# Patient Record
Sex: Female | Born: 1985 | Race: Black or African American | Hispanic: No | Marital: Single | Smoking: Never smoker
Health system: Southern US, Community
[De-identification: ages and names within clinical notes are randomized; demographics above are authoritative.]

## PROBLEM LIST (undated history)

## (undated) DIAGNOSIS — K219 Gastro-esophageal reflux disease without esophagitis: Secondary | ICD-10-CM

## (undated) DIAGNOSIS — F309 Manic episode, unspecified: Secondary | ICD-10-CM

## (undated) DIAGNOSIS — B009 Herpesviral infection, unspecified: Secondary | ICD-10-CM

## (undated) DIAGNOSIS — T4145XA Adverse effect of unspecified anesthetic, initial encounter: Secondary | ICD-10-CM

## (undated) DIAGNOSIS — T883XXA Malignant hyperthermia due to anesthesia, initial encounter: Secondary | ICD-10-CM

## (undated) DIAGNOSIS — B999 Unspecified infectious disease: Secondary | ICD-10-CM

## (undated) DIAGNOSIS — Z8742 Personal history of other diseases of the female genital tract: Secondary | ICD-10-CM

## (undated) DIAGNOSIS — R51 Headache: Secondary | ICD-10-CM

## (undated) DIAGNOSIS — Z8619 Personal history of other infectious and parasitic diseases: Secondary | ICD-10-CM

## (undated) DIAGNOSIS — Z8659 Personal history of other mental and behavioral disorders: Secondary | ICD-10-CM

## (undated) DIAGNOSIS — I10 Essential (primary) hypertension: Secondary | ICD-10-CM

## (undated) DIAGNOSIS — T8859XA Other complications of anesthesia, initial encounter: Secondary | ICD-10-CM

## (undated) DIAGNOSIS — O139 Gestational [pregnancy-induced] hypertension without significant proteinuria, unspecified trimester: Secondary | ICD-10-CM

## (undated) HISTORY — DX: Other complications of anesthesia, initial encounter: T88.59XA

## (undated) HISTORY — DX: Adverse effect of unspecified anesthetic, initial encounter: T41.45XA

## (undated) HISTORY — DX: Essential (primary) hypertension: I10

## (undated) HISTORY — DX: Gestational (pregnancy-induced) hypertension without significant proteinuria, unspecified trimester: O13.9

---
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
## 2005-07-22 ENCOUNTER — Emergency Department (HOSPITAL_COMMUNITY): Admission: EM | Admit: 2005-07-22 | Discharge: 2005-07-22 | Payer: Self-pay | Admitting: Family Medicine

## 2005-10-25 LAB — OB RESULTS CONSOLE VARICELLA ZOSTER ANTIBODY, IGG: Varicella: IMMUNE

## 2005-11-03 ENCOUNTER — Ambulatory Visit: Payer: Self-pay | Admitting: Family Medicine

## 2005-11-03 ENCOUNTER — Encounter (INDEPENDENT_AMBULATORY_CARE_PROVIDER_SITE_OTHER): Payer: Self-pay | Admitting: *Deleted

## 2005-11-04 ENCOUNTER — Ambulatory Visit: Payer: Self-pay | Admitting: Obstetrics & Gynecology

## 2005-11-17 ENCOUNTER — Ambulatory Visit: Payer: Self-pay | Admitting: Gynecology

## 2005-12-12 ENCOUNTER — Ambulatory Visit (HOSPITAL_COMMUNITY): Admission: RE | Admit: 2005-12-12 | Discharge: 2005-12-12 | Payer: Self-pay | Admitting: Obstetrics and Gynecology

## 2005-12-12 ENCOUNTER — Ambulatory Visit: Payer: Self-pay | Admitting: Gynecology

## 2005-12-15 ENCOUNTER — Ambulatory Visit: Payer: Self-pay | Admitting: Gynecology

## 2005-12-19 ENCOUNTER — Ambulatory Visit: Payer: Self-pay | Admitting: *Deleted

## 2006-01-02 ENCOUNTER — Ambulatory Visit: Payer: Self-pay | Admitting: Family Medicine

## 2006-01-23 ENCOUNTER — Ambulatory Visit: Payer: Self-pay | Admitting: Obstetrics & Gynecology

## 2006-01-24 ENCOUNTER — Ambulatory Visit (HOSPITAL_COMMUNITY): Admission: RE | Admit: 2006-01-24 | Discharge: 2006-01-24 | Payer: Self-pay | Admitting: Gynecology

## 2006-02-09 ENCOUNTER — Ambulatory Visit: Payer: Self-pay | Admitting: Obstetrics & Gynecology

## 2006-02-23 ENCOUNTER — Ambulatory Visit: Payer: Self-pay | Admitting: Family Medicine

## 2006-03-09 ENCOUNTER — Ambulatory Visit (HOSPITAL_COMMUNITY): Admission: RE | Admit: 2006-03-09 | Discharge: 2006-03-09 | Payer: Self-pay | Admitting: Obstetrics and Gynecology

## 2006-03-09 ENCOUNTER — Ambulatory Visit: Payer: Self-pay | Admitting: Obstetrics & Gynecology

## 2006-03-13 ENCOUNTER — Ambulatory Visit: Payer: Self-pay | Admitting: Family Medicine

## 2006-03-16 ENCOUNTER — Ambulatory Visit: Payer: Self-pay | Admitting: Family Medicine

## 2006-03-20 ENCOUNTER — Ambulatory Visit: Payer: Self-pay | Admitting: Obstetrics & Gynecology

## 2006-03-23 ENCOUNTER — Ambulatory Visit: Payer: Self-pay | Admitting: Family Medicine

## 2006-03-27 ENCOUNTER — Ambulatory Visit: Payer: Self-pay | Admitting: Obstetrics & Gynecology

## 2006-03-30 ENCOUNTER — Ambulatory Visit: Payer: Self-pay | Admitting: Family Medicine

## 2006-04-03 ENCOUNTER — Ambulatory Visit: Payer: Self-pay | Admitting: Family Medicine

## 2006-04-06 ENCOUNTER — Ambulatory Visit: Payer: Self-pay | Admitting: Family Medicine

## 2006-04-10 ENCOUNTER — Ambulatory Visit: Payer: Self-pay | Admitting: Obstetrics and Gynecology

## 2006-04-13 ENCOUNTER — Ambulatory Visit: Payer: Self-pay | Admitting: Family Medicine

## 2006-04-13 ENCOUNTER — Ambulatory Visit (HOSPITAL_COMMUNITY): Admission: RE | Admit: 2006-04-13 | Discharge: 2006-04-13 | Payer: Self-pay | Admitting: Family Medicine

## 2006-04-17 ENCOUNTER — Ambulatory Visit: Payer: Self-pay | Admitting: Gynecology

## 2006-04-20 ENCOUNTER — Ambulatory Visit: Payer: Self-pay | Admitting: Family Medicine

## 2006-04-21 ENCOUNTER — Ambulatory Visit: Payer: Self-pay | Admitting: Obstetrics & Gynecology

## 2006-04-22 ENCOUNTER — Ambulatory Visit: Payer: Self-pay | Admitting: Obstetrics and Gynecology

## 2006-04-22 ENCOUNTER — Inpatient Hospital Stay (HOSPITAL_COMMUNITY): Admission: AD | Admit: 2006-04-22 | Discharge: 2006-04-22 | Payer: Self-pay | Admitting: Gynecology

## 2006-04-24 ENCOUNTER — Ambulatory Visit: Payer: Self-pay | Admitting: Obstetrics & Gynecology

## 2006-04-24 ENCOUNTER — Ambulatory Visit (HOSPITAL_COMMUNITY): Admission: RE | Admit: 2006-04-24 | Discharge: 2006-04-24 | Payer: Self-pay | Admitting: Obstetrics & Gynecology

## 2006-04-27 ENCOUNTER — Inpatient Hospital Stay (HOSPITAL_COMMUNITY): Admission: AD | Admit: 2006-04-27 | Discharge: 2006-04-29 | Payer: Self-pay | Admitting: Obstetrics & Gynecology

## 2006-04-27 ENCOUNTER — Ambulatory Visit: Payer: Self-pay | Admitting: *Deleted

## 2006-04-27 ENCOUNTER — Ambulatory Visit: Payer: Self-pay | Admitting: Family Medicine

## 2006-04-27 ENCOUNTER — Encounter (INDEPENDENT_AMBULATORY_CARE_PROVIDER_SITE_OTHER): Payer: Self-pay | Admitting: Specialist

## 2006-09-06 ENCOUNTER — Emergency Department (HOSPITAL_COMMUNITY): Admission: EM | Admit: 2006-09-06 | Discharge: 2006-09-06 | Payer: Self-pay | Admitting: Family Medicine

## 2007-06-19 ENCOUNTER — Emergency Department (HOSPITAL_COMMUNITY): Admission: EM | Admit: 2007-06-19 | Discharge: 2007-06-19 | Payer: Self-pay | Admitting: Emergency Medicine

## 2007-12-08 ENCOUNTER — Emergency Department (HOSPITAL_COMMUNITY): Admission: EM | Admit: 2007-12-08 | Discharge: 2007-12-08 | Payer: Self-pay | Admitting: Emergency Medicine

## 2009-02-02 ENCOUNTER — Emergency Department (HOSPITAL_COMMUNITY): Admission: EM | Admit: 2009-02-02 | Discharge: 2009-02-02 | Payer: Self-pay | Admitting: Emergency Medicine

## 2010-02-07 ENCOUNTER — Encounter: Payer: Self-pay | Admitting: *Deleted

## 2010-04-04 LAB — URINALYSIS, ROUTINE W REFLEX MICROSCOPIC
Ketones, ur: 15 mg/dL — AB
Nitrite: NEGATIVE
Urobilinogen, UA: 1 mg/dL (ref 0.0–1.0)

## 2010-04-04 LAB — URINE MICROSCOPIC-ADD ON

## 2010-04-04 LAB — POCT I-STAT, CHEM 8
Calcium, Ion: 1.1 mmol/L — ABNORMAL LOW (ref 1.12–1.32)
Creatinine, Ser: 0.8 mg/dL (ref 0.4–1.2)
Glucose, Bld: 124 mg/dL — ABNORMAL HIGH (ref 70–99)
Potassium: 3.9 mEq/L (ref 3.5–5.1)
Sodium: 140 mEq/L (ref 135–145)

## 2010-04-26 DIAGNOSIS — F309 Manic episode, unspecified: Secondary | ICD-10-CM | POA: Insufficient documentation

## 2010-04-26 DIAGNOSIS — A6000 Herpesviral infection of urogenital system, unspecified: Secondary | ICD-10-CM | POA: Insufficient documentation

## 2010-10-29 LAB — POCT PREGNANCY, URINE: Preg Test, Ur: NEGATIVE

## 2010-12-02 DIAGNOSIS — E559 Vitamin D deficiency, unspecified: Secondary | ICD-10-CM | POA: Insufficient documentation

## 2011-04-26 DIAGNOSIS — F32A Depression, unspecified: Secondary | ICD-10-CM | POA: Insufficient documentation

## 2011-04-26 DIAGNOSIS — F329 Major depressive disorder, single episode, unspecified: Secondary | ICD-10-CM | POA: Insufficient documentation

## 2012-01-18 NOTE — L&D Delivery Note (Signed)
Delivery Note At 11:33 AM a viable female was delivered via Vaginal, Spontaneous Delivery (Presentation: Right Occiput Anterior).  APGAR: 9, 9; weight .   Placenta status: Intact, Spontaneous.  Cord: 3 vessels with the following complications: None.    Anesthesia: Epidural  Episiotomy: None Lacerations: None Suture Repair: NA Est. Blood Loss (mL): 450  NSVD over intact perineum. Viable crying infant to abdomen. Active management of 3rd stage with pit and traction. Placenta delivered intact with 3V cord. Pt had excessive bleeding and slow firming of uterus. Pt given Cytotec PR and improved in bleeding with multiple sweeps. Hemostatic after cytotec administration. EBL 450.  Mom to postpartum.  Baby to nursery-stable.  Tawana Scale 09/12/2012, 11:58 AM

## 2012-04-17 ENCOUNTER — Encounter (HOSPITAL_COMMUNITY): Payer: Self-pay

## 2012-04-17 ENCOUNTER — Inpatient Hospital Stay (HOSPITAL_COMMUNITY): Payer: Medicaid Other

## 2012-04-17 ENCOUNTER — Inpatient Hospital Stay (HOSPITAL_COMMUNITY)
Admission: AD | Admit: 2012-04-17 | Discharge: 2012-04-17 | Disposition: A | Discharge status: Home / Self Care | Payer: Medicaid Other | Source: Ambulatory Visit | Attending: Obstetrics & Gynecology | Admitting: Obstetrics & Gynecology

## 2012-04-17 DIAGNOSIS — R109 Unspecified abdominal pain: Secondary | ICD-10-CM | POA: Insufficient documentation

## 2012-04-17 DIAGNOSIS — O99891 Other specified diseases and conditions complicating pregnancy: Secondary | ICD-10-CM | POA: Insufficient documentation

## 2012-04-17 DIAGNOSIS — O26899 Other specified pregnancy related conditions, unspecified trimester: Secondary | ICD-10-CM

## 2012-04-17 HISTORY — DX: Unspecified infectious disease: B99.9

## 2012-04-17 LAB — CBC
HCT: 36.8 % (ref 36.0–46.0)
MCV: 86.4 fL (ref 78.0–100.0)
Platelets: 258 10*3/uL (ref 150–400)
RDW: 13.4 % (ref 11.5–15.5)

## 2012-04-17 LAB — URINE MICROSCOPIC-ADD ON

## 2012-04-17 LAB — URINALYSIS, ROUTINE W REFLEX MICROSCOPIC
Bilirubin Urine: NEGATIVE
Glucose, UA: NEGATIVE mg/dL
Nitrite: NEGATIVE
Specific Gravity, Urine: >1.03 — ABNORMAL HIGH (ref 1.005–1.030)

## 2012-04-17 NOTE — MAU Provider Note (Signed)
History     CSN: [id number]  Arrival date and time: 04/17/12 1313   First Provider Initiated Contact with Patient 04/17/12 1559      Chief Complaint  Patient presents with  . Abdominal Pain   HPI Donna Thomas is a 27 y.o. female G2P1001 at unknown gestation who presents to MAU w/ complaint of lower abdominal pressure. She reports that she has had a positive pregnancy test at home, and believes that she is approximately [redacted] weeks gestation. She reports not having any similar abdominal pressure this early with her prior pregnancy. She did have some earlier spotting, but no recent vaginal bleeding, discharge, or leakage of fluid. She is having heartburn symptoms every other day, but is not using any treatment presently. No dysuria, hematuria, constipation, or diarrhea. She has not started pre-natal care at this point, and does not have any scheduled. She has started taking pre-natal vitamins. She reports no complications with her prior pregnancy.  Past Medical History  Diagnosis Date  . Infection     History reviewed. No pertinent past surgical history.  History reviewed. No pertinent family history.  History  Substance Use Topics  . Smoking status: Never Smoker   . Smokeless tobacco: Not on file  . Alcohol Use: No    Allergies: No Known Allergies  Prescriptions prior to admission  Medication Sig Dispense Refill  . Prenatal Vit-Fe Fumarate-FA (PRENATAL MULTIVITAMIN) TABS Take 1 tablet by mouth daily at 12 noon.        Review of Systems  Constitutional: Negative for fever and chills.  HENT: Negative for congestion.   Eyes: Negative for blurred vision and double vision.  Respiratory: Negative for cough, shortness of breath and wheezing.   Cardiovascular: Negative for chest pain, palpitations and leg swelling.  Gastrointestinal: Positive for heartburn, nausea, vomiting and abdominal pain (lower abdominal pressure). Negative for diarrhea and constipation.  Genitourinary: Negative  for dysuria, hematuria and flank pain.       Negative for vaginal bleeding, discharge, or leakage of fluid.  Musculoskeletal: Negative for back pain.  Neurological: Negative for dizziness, tingling, sensory change, focal weakness and headaches.   Physical Exam   Blood pressure 133/86, pulse 95, temperature 98.6 F (37 C), temperature source Oral, resp. rate 18, height 5\' 6"  (1.676 m), weight 111.698 kg (246 lb 4 oz), last menstrual period 02/12/2012, SpO2 100.00%.  Physical Exam  Nursing note and vitals reviewed. Constitutional: She is oriented to person, place, and time. She appears well-developed. No distress.  obese  HENT:  Head: Normocephalic and atraumatic.  Eyes: Conjunctivae and EOM are normal. Pupils are equal, round, and reactive to light.  Neck: Normal range of motion. Neck supple.  Cardiovascular: Normal rate, regular rhythm, normal heart sounds and intact distal pulses.   Respiratory: Effort normal and breath sounds normal. No respiratory distress.  GI: Soft. Bowel sounds are normal. She exhibits distension (gravid). There is no tenderness. There is no rebound, no guarding and no CVA tenderness.  Genitourinary: Uterus is not tender. Cervix exhibits no motion tenderness, no discharge and no friability. Right adnexum displays no mass and no tenderness. Left adnexum displays no mass and no tenderness. No tenderness or bleeding around the vagina. Vaginal discharge (slight milky white discharge ) found.  Lymphadenopathy:    She has no cervical adenopathy.  Neurological: She is alert and oriented to person, place, and time.  Skin: Skin is warm and dry.  Psychiatric: She has a normal mood and affect. Her behavior is normal. Judgment  and thought content normal.    MAU Course  Procedures  Results for orders placed during the hospital encounter of 04/17/12 (from the past 24 hour(s))  URINALYSIS, ROUTINE W REFLEX MICROSCOPIC     Status: Abnormal   Collection Time    04/17/12  2:16  PM      Result Value Range   Color, Urine YELLOW  YELLOW   APPearance CLEAR  CLEAR   Specific Gravity, Urine >1.030 (*) 1.005 - 1.030   pH 6.0  5.0 - 8.0   Glucose, UA NEGATIVE  NEGATIVE mg/dL   Hgb urine dipstick MODERATE (*) NEGATIVE   Bilirubin Urine NEGATIVE  NEGATIVE   Ketones, ur NEGATIVE  NEGATIVE mg/dL   Protein, ur NEGATIVE  NEGATIVE mg/dL   Urobilinogen, UA 0.2  0.0 - 1.0 mg/dL   Nitrite NEGATIVE  NEGATIVE   Leukocytes, UA MODERATE (*) NEGATIVE  URINE MICROSCOPIC-ADD ON     Status: Abnormal   Collection Time    04/17/12  2:16 PM      Result Value Range   Squamous Epithelial / LPF FEW (*) RARE   WBC, UA 7-10  <3 WBC/hpf   RBC / HPF 0-2  <3 RBC/hpf   Bacteria, UA FEW (*) RARE   Urine-Other MUCOUS PRESENT    POCT PREGNANCY, URINE     Status: Abnormal   Collection Time    04/17/12  2:29 PM      Result Value Range   Preg Test, Ur POSITIVE (*) NEGATIVE  CBC     Status: None   Collection Time    04/17/12  3:00 PM      Result Value Range   WBC 7.5  4.0 - 10.5 K/uL   RBC 4.26  3.87 - 5.11 MIL/uL   Hemoglobin 12.6  12.0 - 15.0 g/dL   HCT 16.1  09.6 - 04.5 %   MCV 86.4  78.0 - 100.0 fL   MCH 29.6  26.0 - 34.0 pg   MCHC 34.2  30.0 - 36.0 g/dL   RDW 40.9  81.1 - 91.4 %   Platelets 258  150 - 400 K/uL  ABO/RH     Status: None   Collection Time    04/17/12  3:00 PM      Result Value Range   ABO/RH(D) A POS    HCG, QUANTITATIVE, PREGNANCY     Status: Abnormal   Collection Time    04/17/12  3:00 PM      Result Value Range   hCG, Beta Chain, Quant, S 28885 (*) <5 mIU/mL   Wet prep performed- inadequate specimen with no saline, per lab-  Discussed with pt- pt refused another swab for wet prep GC/chlamydia pending Hematuria- asymptomatic- urine culture pending     Assessment and Plan  1. IUP at [redacted]w[redacted]d by ultrasound  - D/C to home  - Continue prenatal vitamins  - Follow-up with OB clinic to establish care  Follow-up Information   Follow up with [facility] [place] [facility]. (As needed if symptoms worsen)    Contact information:   [address] [place] [address] [phone]      Follow up with [facility]. (Will call with appointment for pre-natal care, call if have not heard by next week)    Contact information:   [address] [place] [address] [phone]     I examined pt with Corky Mull and agree with management  Dorrene German 04/17/2012, 4:57 PM

## 2012-04-20 LAB — URINE CULTURE

## 2012-04-21 ENCOUNTER — Other Ambulatory Visit: Payer: Self-pay | Admitting: Medical

## 2012-04-21 ENCOUNTER — Telehealth (HOSPITAL_COMMUNITY): Payer: Self-pay | Admitting: Gynecology

## 2012-04-21 ENCOUNTER — Telehealth: Payer: Self-pay | Admitting: Medical

## 2012-04-21 DIAGNOSIS — O2342 Unspecified infection of urinary tract in pregnancy, second trimester: Secondary | ICD-10-CM

## 2012-04-21 MED ORDER — CEPHALEXIN 500 MG PO CAPS: 500.0000 mg | ORAL_CAPSULE | Freq: Four times a day (QID) | ORAL | Status: DC

## 2012-04-21 NOTE — Telephone Encounter (Signed)
LM for patient to return call to MAU. Rx for Keflex for UTI sent to [facility] on [address].  Freddi Starr, PA-C  04/21/2012 2:00 PM

## 2012-04-23 ENCOUNTER — Telehealth: Payer: Self-pay | Admitting: General Practice

## 2012-04-23 NOTE — Telephone Encounter (Signed)
Message copied by Kathee Delton on Mon Apr 23, 2012 11:29 AM ------      Message from: Allie Bossier      Created: Fri Apr 20, 2012 10:03 AM       Please call her with a prescription for macrobid BID for 10 days.       Thanks ------

## 2012-04-23 NOTE — Telephone Encounter (Signed)
Called patient and informed her of results- patient already aware. Rx for keflex already been sent it. Patient verbalized understanding, no further questions

## 2012-04-24 ENCOUNTER — Encounter: Payer: Self-pay | Admitting: *Deleted

## 2012-04-24 ENCOUNTER — Other Ambulatory Visit: Payer: Medicaid Other

## 2012-04-25 LAB — OBSTETRIC PANEL
Antibody Screen: NEGATIVE
HCT: 36.8 % (ref 36.0–46.0)
Hemoglobin: 12.6 g/dL (ref 12.0–15.0)
Lymphocytes Relative: 16 % (ref 12–46)
MCHC: 34.2 g/dL (ref 30.0–36.0)
Monocytes Absolute: 0.6 10*3/uL (ref 0.1–1.0)
Monocytes Relative: 9 % (ref 3–12)
Neutro Abs: 5.2 10*3/uL (ref 1.7–7.7)
Rh Type: POSITIVE
Rubella: 4.05 Index — ABNORMAL HIGH (ref <0.90)
WBC: 7 10*3/uL (ref 4.0–10.5)

## 2012-05-22 ENCOUNTER — Other Ambulatory Visit (HOSPITAL_COMMUNITY)
Admission: RE | Admit: 2012-05-22 | Discharge: 2012-05-22 | Disposition: A | Discharge status: Home / Self Care | Payer: Medicaid Other | Source: Ambulatory Visit | Attending: Obstetrics & Gynecology | Admitting: Obstetrics & Gynecology

## 2012-05-22 ENCOUNTER — Other Ambulatory Visit: Payer: Self-pay | Admitting: Obstetrics & Gynecology

## 2012-05-22 ENCOUNTER — Encounter: Payer: Self-pay | Admitting: Obstetrics & Gynecology

## 2012-05-22 ENCOUNTER — Ambulatory Visit (INDEPENDENT_AMBULATORY_CARE_PROVIDER_SITE_OTHER): Payer: Medicaid Other | Admitting: Obstetrics & Gynecology

## 2012-05-22 VITALS — BP 134/88 | Wt 246.8 lb

## 2012-05-22 DIAGNOSIS — Z1151 Encounter for screening for human papillomavirus (HPV): Secondary | ICD-10-CM | POA: Insufficient documentation

## 2012-05-22 DIAGNOSIS — Z01419 Encounter for gynecological examination (general) (routine) without abnormal findings: Secondary | ICD-10-CM | POA: Insufficient documentation

## 2012-05-22 DIAGNOSIS — O0930 Supervision of pregnancy with insufficient antenatal care, unspecified trimester: Secondary | ICD-10-CM | POA: Insufficient documentation

## 2012-05-22 DIAGNOSIS — O09292 Supervision of pregnancy with other poor reproductive or obstetric history, second trimester: Secondary | ICD-10-CM

## 2012-05-22 DIAGNOSIS — R87810 Cervical high risk human papillomavirus (HPV) DNA test positive: Secondary | ICD-10-CM | POA: Insufficient documentation

## 2012-05-22 DIAGNOSIS — O0932 Supervision of pregnancy with insufficient antenatal care, second trimester: Secondary | ICD-10-CM

## 2012-05-22 DIAGNOSIS — O09299 Supervision of pregnancy with other poor reproductive or obstetric history, unspecified trimester: Secondary | ICD-10-CM

## 2012-05-22 LAB — POCT URINALYSIS DIP (DEVICE)
Glucose, UA: NEGATIVE mg/dL
Hgb urine dipstick: NEGATIVE
Specific Gravity, Urine: >=1.03 (ref 1.005–1.030)

## 2012-05-22 NOTE — Progress Notes (Signed)
Prior H/O GHTN and SGA

## 2012-05-22 NOTE — Patient Instructions (Signed)
Pregnancy - Second Trimester The second trimester of pregnancy (3 to 6 months) is a period of rapid growth for you and your baby. At the end of the sixth month, your baby is about 9 inches long and weighs 1 1/2 pounds. You will begin to feel the baby move between 18 and 20 weeks of the pregnancy. This is called quickening. Weight gain is faster. A clear fluid (colostrum) may leak out of your breasts. You may feel small contractions of the womb (uterus). This is known as false labor or Braxton-Hicks contractions. This is like a practice for labor when the baby is ready to be born. Usually, the problems with morning sickness have usually passed by the end of your first trimester. Some women develop small dark blotches (called cholasma, mask of pregnancy) on their face that usually goes away after the baby is born. Exposure to the sun makes the blotches worse. Acne may also develop in some pregnant women and pregnant women who have acne, may find that it goes away. PRENATAL EXAMS  Blood work may continue to be done during prenatal exams. These tests are done to check on your health and the probable health of your baby. Blood work is used to follow your blood levels (hemoglobin). Anemia (low hemoglobin) is common during pregnancy. Iron and vitamins are given to help prevent this. You will also be checked for diabetes between 24 and 28 weeks of the pregnancy. Some of the previous blood tests may be repeated.  The size of the uterus is measured during each visit. This is to make sure that the baby is continuing to grow properly according to the dates of the pregnancy.  Your blood pressure is checked every prenatal visit. This is to make sure you are not getting toxemia.  Your urine is checked to make sure you do not have an infection, diabetes or protein in the urine.  Your weight is checked often to make sure gains are happening at the suggested rate. This is to ensure that both you and your baby are growing  normally.  Sometimes, an ultrasound is performed to confirm the proper growth and development of the baby. This is a test which bounces harmless sound waves off the baby so your caregiver can more accurately determine due dates. Sometimes, a specialized test is done on the amniotic fluid surrounding the baby. This test is called an amniocentesis. The amniotic fluid is obtained by sticking a needle into the belly (abdomen). This is done to check the chromosomes in instances where there is a concern about possible genetic problems with the baby. It is also sometimes done near the end of pregnancy if an early delivery is required. In this case, it is done to help make sure the baby's lungs are mature enough for the baby to live outside of the womb. CHANGES OCCURING IN THE SECOND TRIMESTER OF PREGNANCY Your body goes through many changes during pregnancy. They vary from person to person. Talk to your caregiver about changes you notice that you are concerned about.  During the second trimester, you will likely have an increase in your appetite. It is normal to have cravings for certain foods. This varies from person to person and pregnancy to pregnancy.  Your lower abdomen will begin to bulge.  You may have to urinate more often because the uterus and baby are pressing on your bladder. It is also common to get more bladder infections during pregnancy (pain with urination). You can help this by   drinking lots of fluids and emptying your bladder before and after intercourse.  You may begin to get stretch marks on your hips, abdomen, and breasts. These are normal changes in the body during pregnancy. There are no exercises or medications to take that prevent this change.  You may begin to develop swollen and bulging veins (varicose veins) in your legs. Wearing support hose, elevating your feet for 15 minutes, 3 to 4 times a day and limiting salt in your diet helps lessen the problem.  Heartburn may develop  as the uterus grows and pushes up against the stomach. Antacids recommended by your caregiver helps with this problem. Also, eating smaller meals 4 to 5 times a day helps.  Constipation can be treated with a stool softener or adding bulk to your diet. Drinking lots of fluids, vegetables, fruits, and whole grains are helpful.  Exercising is also helpful. If you have been very active up until your pregnancy, most of these activities can be continued during your pregnancy. If you have been less active, it is helpful to start an exercise program such as walking.  Hemorrhoids (varicose veins in the rectum) may develop at the end of the second trimester. Warm sitz baths and hemorrhoid cream recommended by your caregiver helps hemorrhoid problems.  Backaches may develop during this time of your pregnancy. Avoid heavy lifting, wear low heal shoes and practice good posture to help with backache problems.  Some pregnant women develop tingling and numbness of their hand and fingers because of swelling and tightening of ligaments in the wrist (carpel tunnel syndrome). This goes away after the baby is born.  As your breasts enlarge, you may have to get a bigger bra. Get a comfortable, cotton, support bra. Do not get a nursing bra until the last month of the pregnancy if you will be nursing the baby.  You may get a dark line from your belly button to the pubic area called the linea nigra.  You may develop rosy cheeks because of increase blood flow to the face.  You may develop spider looking lines of the face, neck, arms and chest. These go away after the baby is born. HOME CARE INSTRUCTIONS   It is extremely important to avoid all smoking, herbs, alcohol, and unprescribed drugs during your pregnancy. These chemicals affect the formation and growth of the baby. Avoid these chemicals throughout the pregnancy to ensure the delivery of a healthy infant.  Most of your home care instructions are the same as  suggested for the first trimester of your pregnancy. Keep your caregiver's appointments. Follow your caregiver's instructions regarding medication use, exercise and diet.  During pregnancy, you are providing food for you and your baby. Continue to eat regular, well-balanced meals. Choose foods such as meat, fish, milk and other low fat dairy products, vegetables, fruits, and whole-grain breads and cereals. Your caregiver will tell you of the ideal weight gain.  A physical sexual relationship may be continued up until near the end of pregnancy if there are no other problems. Problems could include early (premature) leaking of amniotic fluid from the membranes, vaginal bleeding, abdominal pain, or other medical or pregnancy problems.  Exercise regularly if there are no restrictions. Check with your caregiver if you are unsure of the safety of some of your exercises. The greatest weight gain will occur in the last 2 trimesters of pregnancy. Exercise will help you:  Control your weight.  Get you in shape for labor and delivery.  Lose weight   after you have the baby.  Wear a good support or jogging bra for breast tenderness during pregnancy. This may help if worn during sleep. Pads or tissues may be used in the bra if you are leaking colostrum.  Do not use hot tubs, steam rooms or saunas throughout the pregnancy.  Wear your seat belt at all times when driving. This protects you and your baby if you are in an accident.  Avoid raw meat, uncooked cheese, cat litter boxes and soil used by cats. These carry germs that can cause birth defects in the baby.  The second trimester is also a good time to visit your dentist for your dental health if this has not been done yet. Getting your teeth cleaned is OK. Use a soft toothbrush. Brush gently during pregnancy.  It is easier to loose urine during pregnancy. Tightening up and strengthening the pelvic muscles will help with this problem. Practice stopping your  urination while you are going to the bathroom. These are the same muscles you need to strengthen. It is also the muscles you would use as if you were trying to stop from passing gas. You can practice tightening these muscles up 10 times a set and repeating this about 3 times per day. Once you know what muscles to tighten up, do not perform these exercises during urination. It is more likely to contribute to an infection by backing up the urine.  Ask for help if you have financial, counseling or nutritional needs during pregnancy. Your caregiver will be able to offer counseling for these needs as well as refer you for other special needs.  Your skin may become oily. If so, wash your face with mild soap, use non-greasy moisturizer and oil or cream based makeup. MEDICATIONS AND DRUG USE IN PREGNANCY  Take prenatal vitamins as directed. The vitamin should contain 1 milligram of folic acid. Keep all vitamins out of reach of children. Only a couple vitamins or tablets containing iron may be fatal to a baby or young child when ingested.  Avoid use of all medications, including herbs, over-the-counter medications, not prescribed or suggested by your caregiver. Only take over-the-counter or prescription medicines for pain, discomfort, or fever as directed by your caregiver. Do not use aspirin.  Let your caregiver also know about herbs you may be using.  Alcohol is related to a number of birth defects. This includes fetal alcohol syndrome. All alcohol, in any form, should be avoided completely. Smoking will cause low birth rate and premature babies.  Street or illegal drugs are very harmful to the baby. They are absolutely forbidden. A baby born to an addicted mother will be addicted at birth. The baby will go through the same withdrawal an adult does. SEEK MEDICAL CARE IF:  You have any concerns or worries during your pregnancy. It is better to call with your questions if you feel they cannot wait, rather  than worry about them. SEEK IMMEDIATE MEDICAL CARE IF:   An unexplained oral temperature above 102 F (38.9 C) develops, or as your caregiver suggests.  You have leaking of fluid from the vagina (birth canal). If leaking membranes are suspected, take your temperature and tell your caregiver of this when you call.  There is vaginal spotting, bleeding, or passing clots. Tell your caregiver of the amount and how many pads are used. Light spotting in pregnancy is common, especially following intercourse.  You develop a bad smelling vaginal discharge with a change in the color from clear   to white.  You continue to feel sick to your stomach (nauseated) and have no relief from remedies suggested. You vomit blood or coffee ground-like materials.  You lose more than 2 pounds of weight or gain more than 2 pounds of weight over 1 week, or as suggested by your caregiver.  You notice swelling of your face, hands, feet, or legs.  You get exposed to German measles and have never had them.  You are exposed to fifth disease or chickenpox.  You develop belly (abdominal) pain. Round ligament discomfort is a common non-cancerous (benign) cause of abdominal pain in pregnancy. Your caregiver still must evaluate you.  You develop a bad headache that does not go away.  You develop fever, diarrhea, pain with urination, or shortness of breath.  You develop visual problems, blurry, or double vision.  You fall or are in a car accident or any kind of trauma.  There is mental or physical violence at home. Document Released: 12/28/2000 Document Revised: 03/28/2011 Document Reviewed: 07/02/2008 [employer] Patient Information 2013 [employer].  

## 2012-05-22 NOTE — Progress Notes (Signed)
Pulse: 86 Would like to breastfeed/pump.

## 2012-05-23 ENCOUNTER — Other Ambulatory Visit: Payer: Self-pay | Admitting: Obstetrics & Gynecology

## 2012-05-23 LAB — WET PREP, GENITAL: Trich, Wet Prep: NONE SEEN

## 2012-05-23 MED ORDER — METRONIDAZOLE 500 MG PO TABS: 500.0000 mg | ORAL_TABLET | Freq: Two times a day (BID) | ORAL | Status: AC

## 2012-05-23 NOTE — Progress Notes (Signed)
Flagyl 500 mg BID 7 days for BV

## 2012-05-24 ENCOUNTER — Telehealth: Payer: Self-pay | Admitting: *Deleted

## 2012-05-24 ENCOUNTER — Ambulatory Visit (HOSPITAL_COMMUNITY): Payer: Medicaid Other

## 2012-05-24 NOTE — Telephone Encounter (Signed)
Called Donna Thomas to notify wet prep showed BV and prescription sent to her pharmacy. Patient voices understanding.

## 2012-05-31 ENCOUNTER — Other Ambulatory Visit (HOSPITAL_COMMUNITY)
Admission: RE | Admit: 2012-05-31 | Discharge: 2012-05-31 | Disposition: A | Discharge status: Home / Self Care | Payer: Medicaid Other | Source: Ambulatory Visit | Attending: Family Medicine | Admitting: Family Medicine

## 2012-05-31 ENCOUNTER — Ambulatory Visit (HOSPITAL_COMMUNITY)
Admission: RE | Admit: 2012-05-31 | Discharge: 2012-05-31 | Disposition: A | Discharge status: Home / Self Care | Payer: Medicaid Other | Source: Ambulatory Visit | Attending: Obstetrics & Gynecology | Admitting: Obstetrics & Gynecology

## 2012-05-31 ENCOUNTER — Ambulatory Visit (INDEPENDENT_AMBULATORY_CARE_PROVIDER_SITE_OTHER): Payer: Medicaid Other | Admitting: Family Medicine

## 2012-05-31 VITALS — BP 123/78 | Temp 97.2°F | Wt 247.2 lb

## 2012-05-31 DIAGNOSIS — R87619 Unspecified abnormal cytological findings in specimens from cervix uteri: Secondary | ICD-10-CM | POA: Insufficient documentation

## 2012-05-31 DIAGNOSIS — Z363 Encounter for antenatal screening for malformations: Secondary | ICD-10-CM | POA: Insufficient documentation

## 2012-05-31 DIAGNOSIS — O358XX0 Maternal care for other (suspected) fetal abnormality and damage, not applicable or unspecified: Secondary | ICD-10-CM | POA: Insufficient documentation

## 2012-05-31 DIAGNOSIS — IMO0002 Reserved for concepts with insufficient information to code with codable children: Secondary | ICD-10-CM

## 2012-05-31 DIAGNOSIS — O09292 Supervision of pregnancy with other poor reproductive or obstetric history, second trimester: Secondary | ICD-10-CM

## 2012-05-31 DIAGNOSIS — O0930 Supervision of pregnancy with insufficient antenatal care, unspecified trimester: Secondary | ICD-10-CM

## 2012-05-31 DIAGNOSIS — Z1389 Encounter for screening for other disorder: Secondary | ICD-10-CM | POA: Insufficient documentation

## 2012-05-31 DIAGNOSIS — R87811 Vaginal high risk human papillomavirus (HPV) DNA test positive: Secondary | ICD-10-CM

## 2012-05-31 DIAGNOSIS — O0932 Supervision of pregnancy with insufficient antenatal care, second trimester: Secondary | ICD-10-CM

## 2012-05-31 LAB — POCT URINALYSIS DIP (DEVICE)
Bilirubin Urine: NEGATIVE
Glucose, UA: NEGATIVE mg/dL
Nitrite: NEGATIVE
Urobilinogen, UA: 0.2 mg/dL (ref 0.0–1.0)
pH: 6.5 (ref 5.0–8.0)

## 2012-05-31 NOTE — Progress Notes (Signed)
Pulse: 82

## 2012-05-31 NOTE — Patient Instructions (Addendum)
Pregnancy - Third Trimester The third trimester of pregnancy (the last 3 months) is a period of the most rapid growth for you and your baby. The baby approaches a length of 20 inches and a weight of 6 to 10 pounds. The baby is adding on fat and getting ready for life outside your body. While inside, babies have periods of sleeping and waking, suck their thumbs, and hiccups. You can often feel small contractions of the uterus. This is false labor. It is also called Braxton-Hicks contractions. This is like a practice for labor. The usual problems in this stage of pregnancy include more difficulty breathing, swelling of the hands and feet from water retention, and having to urinate more often because of the uterus and baby pressing on your bladder.  PRENATAL EXAMS  Blood work may continue to be done during prenatal exams. These tests are done to check on your health and the probable health of your baby. Blood work is used to follow your blood levels (hemoglobin). Anemia (low hemoglobin) is common during pregnancy. Iron and vitamins are given to help prevent this. You may also continue to be checked for diabetes. Some of the past blood tests may be done again.  The size of the uterus is measured during each visit. This makes sure your baby is growing properly according to your pregnancy dates.  Your blood pressure is checked every prenatal visit. This is to make sure you are not getting toxemia.  Your urine is checked every prenatal visit for infection, diabetes and protein.  Your weight is checked at each visit. This is done to make sure gains are happening at the suggested rate and that you and your baby are growing normally.  Sometimes, an ultrasound is performed to confirm the position and the proper growth and development of the baby. This is a test done that bounces harmless sound waves off the baby so your caregiver can more accurately determine due dates.  Discuss the type of pain medication  and anesthesia you will have during your labor and delivery.  Discuss the possibility and anesthesia if a Cesarean Section might be necessary.  Inform your caregiver if there is any mental or physical violence at home. Sometimes, a specialized non-stress test, contraction stress test and biophysical profile are done to make sure the baby is not having a problem. Checking the amniotic fluid surrounding the baby is called an amniocentesis. The amniotic fluid is removed by sticking a needle into the belly (abdomen). This is sometimes done near the end of pregnancy if an early delivery is required. In this case, it is done to help make sure the baby's lungs are mature enough for the baby to live outside of the womb. If the lungs are not mature and it is unsafe to deliver the baby, an injection of cortisone medication is given to the mother 1 to 2 days before the delivery. This helps the baby's lungs mature and makes it safer to deliver the baby. CHANGES OCCURING IN THE THIRD TRIMESTER OF PREGNANCY Your body goes through many changes during pregnancy. They vary from person to person. Talk to your caregiver about changes you notice and are concerned about.  During the last trimester, you have probably had an increase in your appetite. It is normal to have cravings for certain foods. This varies from person to person and pregnancy to pregnancy.  You may begin to get stretch marks on your hips, abdomen, and breasts. These are normal changes in the body  during pregnancy. There are no exercises or medications to take which prevent this change.  Constipation may be treated with a stool softener or adding bulk to your diet. Drinking lots of fluids, fiber in vegetables, fruits, and whole grains are helpful.  Exercising is also helpful. If you have been very active up until your pregnancy, most of these activities can be continued during your pregnancy. If you have been less active, it is helpful to start an  exercise program such as walking. Consult your caregiver before starting exercise programs.  Avoid all smoking, alcohol, un-prescribed drugs, herbs and "street drugs" during your pregnancy. These chemicals affect the formation and growth of the baby. Avoid chemicals throughout the pregnancy to ensure the delivery of a healthy infant.  Backache, varicose veins and hemorrhoids may develop or get worse.  You will tire more easily in the third trimester, which is normal.  The baby's movements may be stronger and more often.  You may become short of breath easily.  Your belly button may stick out.  A yellow discharge may leak from your breasts called colostrum.  You may have a bloody mucus discharge. This usually occurs a few days to a week before labor begins. HOME CARE INSTRUCTIONS   Keep your caregiver's appointments. Follow your caregiver's instructions regarding medication use, exercise, and diet.  During pregnancy, you are providing food for you and your baby. Continue to eat regular, well-balanced meals. Choose foods such as meat, fish, milk and other low fat dairy products, vegetables, fruits, and whole-grain breads and cereals. Your caregiver will tell you of the ideal weight gain.  A physical sexual relationship may be continued throughout pregnancy if there are no other problems such as early (premature) leaking of amniotic fluid from the membranes, vaginal bleeding, or belly (abdominal) pain.  Exercise regularly if there are no restrictions. Check with your caregiver if you are unsure of the safety of your exercises. Greater weight gain will occur in the last 2 trimesters of pregnancy. Exercising helps:  Control your weight.  Get you in shape for labor and delivery.  You lose weight after you deliver.  Rest a lot with legs elevated, or as needed for leg cramps or low back pain.  Wear a good support or jogging bra for breast tenderness during pregnancy. This may help if worn  during sleep. Pads or tissues may be used in the bra if you are leaking colostrum.  Do not use hot tubs, steam rooms, or saunas.  Wear your seat belt when driving. This protects you and your baby if you are in an accident.  Avoid raw meat, cat litter boxes and soil used by cats. These carry germs that can cause birth defects in the baby.  It is easier to loose urine during pregnancy. Tightening up and strengthening the pelvic muscles will help with this problem. You can practice stopping your urination while you are going to the bathroom. These are the same muscles you need to strengthen. It is also the muscles you would use if you were trying to stop from passing gas. You can practice tightening these muscles up 10 times a set and repeating this about 3 times per day. Once you know what muscles to tighten up, do not perform these exercises during urination. It is more likely to cause an infection by backing up the urine.  Ask for help if you have financial, counseling or nutritional needs during pregnancy. Your caregiver will be able to offer counseling for these  needs as well as refer you for other special needs.  Make a list of emergency phone numbers and have them available.  Plan on getting help from family or friends when you go home from the hospital.  Make a trial run to the hospital.  Take prenatal classes with the father to understand, practice and ask questions about the labor and delivery.  Prepare the baby's room/nursery.  Do not travel out of the city unless it is absolutely necessary and with the advice of your caregiver.  Wear only low or no heal shoes to have better balance and prevent falling. MEDICATIONS AND DRUG USE IN PREGNANCY  Take prenatal vitamins as directed. The vitamin should contain 1 milligram of folic acid. Keep all vitamins out of reach of children. Only a couple vitamins or tablets containing iron may be fatal to a baby or young child when  ingested.  Avoid use of all medications, including herbs, over-the-counter medications, not prescribed or suggested by your caregiver. Only take over-the-counter or prescription medicines for pain, discomfort, or fever as directed by your caregiver. Do not use aspirin, ibuprofen (Motrin, Advil, Nuprin) or naproxen (Aleve) unless OK'd by your caregiver.  Let your caregiver also know about herbs you may be using.  Alcohol is related to a number of birth defects. This includes fetal alcohol syndrome. All alcohol, in any form, should be avoided completely. Smoking will cause low birth rate and premature babies.  Street/illegal drugs are very harmful to the baby. They are absolutely forbidden. A baby born to an addicted mother will be addicted at birth. The baby will go through the same withdrawal an adult does. SEEK MEDICAL CARE IF: You have any concerns or worries during your pregnancy. It is better to call with your questions if you feel they cannot wait, rather than worry about them. DECISIONS ABOUT CIRCUMCISION You may or may not know the sex of your baby. If you know your baby is a boy, it may be time to think about circumcision. Circumcision is the removal of the foreskin of the penis. This is the skin that covers the sensitive end of the penis. There is no proven medical need for this. Often this decision is made on what is popular at the time or based upon religious beliefs and social issues. You can discuss these issues with your caregiver or pediatrician. SEEK IMMEDIATE MEDICAL CARE IF:   An unexplained oral temperature above 102 F (38.9 C) develops, or as your caregiver suggests.  You have leaking of fluid from the vagina (birth canal). If leaking membranes are suspected, take your temperature and tell your caregiver of this when you call.  There is vaginal spotting, bleeding or passing clots. Tell your caregiver of the amount and how many pads are used.  You develop a bad smelling  vaginal discharge with a change in the color from clear to white.  You develop vomiting that lasts more than 24 hours.  You develop chills or fever.  You develop shortness of breath.  You develop burning on urination.  You loose more than 2 pounds of weight or gain more than 2 pounds of weight or as suggested by your caregiver.  You notice sudden swelling of your face, hands, and feet or legs.  You develop belly (abdominal) pain. Round ligament discomfort is a common non-cancerous (benign) cause of abdominal pain in pregnancy. Your caregiver still must evaluate you.  You develop a severe headache that does not go away.  You develop visual  problems, blurred or double vision.  If you have not felt your baby move for more than 1 hour. If you think the baby is not moving as much as usual, eat something with sugar in it and lie down on your left side for an hour. The baby should move at least 4 to 5 times per hour. Call right away if your baby moves less than that.  You fall, are in a car accident or any kind of trauma.  There is mental or physical violence at home. Document Released: 12/28/2000 Document Revised: 03/28/2011 Document Reviewed: 07/02/2008 [facility] Patient Information 2013 [place], [place].  Breastfeeding Deciding to breastfeed is one of the best choices you can make for you and your baby. The information that follows gives a brief overview of the benefits of breastfeeding as well as common topics surrounding breastfeeding. BENEFITS OF BREASTFEEDING For the baby  The first milk (colostrum) helps the baby's digestive system function better.   There are antibodies in the mother's milk that help the baby fight off infections.   The baby has a lower incidence of asthma, allergies, and sudden infant death syndrome (SIDS).   The nutrients in breast milk are better for the baby than infant formulas, and breast milk helps the baby's brain grow better.   Babies who  breastfeed have less gas, colic, and constipation.  For the mother  Breastfeeding helps develop a very special bond between the mother and her baby.   Breastfeeding is convenient, always available at the correct temperature, and costs nothing.   Breastfeeding burns calories in the mother and helps her lose weight that was gained during pregnancy.   Breastfeeding makes the uterus contract back down to normal size faster and slows bleeding following delivery.   Breastfeeding mothers have a lower risk of developing breast cancer.  BREASTFEEDING FREQUENCY  A healthy, full-term baby may breastfeed as often as every hour or space his or her feedings to every 3 hours.   Watch your baby for signs of hunger. Nurse your baby if he or she shows signs of hunger. How often you nurse will vary from baby to baby.   Nurse as often as the baby requests, or when you feel the need to reduce the fullness of your breasts.   Awaken the baby if it has been 3 4 hours since the last feeding.   Frequent feeding will help the mother make more milk and will help prevent problems, such as sore nipples and engorgement of the breasts.  BABY'S POSITION AT THE BREAST  Whether lying down or sitting, be sure that the baby's tummy is facing your tummy.   Support the breast with 4 fingers underneath the breast and the thumb above. Make sure your fingers are well away from the nipple and baby's mouth.   Stroke the baby's lips gently with your finger or nipple.   When the baby's mouth is open wide enough, place all of your nipple and as much of the areola as possible into your baby's mouth.   Pull the baby in close so the tip of the nose and the baby's cheeks touch the breast during the feeding.  FEEDINGS AND SUCTION  The length of each feeding varies from baby to baby and from feeding to feeding.   The baby must suck about 2 3 minutes for your milk to get to him or her. This is called a "let down."  For this reason, allow the baby to feed on each breast as  long as he or she wants. Your baby will end the feeding when he or she has received the right balance of nutrients.   To break the suction, put your finger into the corner of the baby's mouth and slide it between his or her gums before removing your breast from his or her mouth. This will help prevent sore nipples.  HOW TO TELL WHETHER YOUR BABY IS GETTING ENOUGH BREAST MILK. Wondering whether or not your baby is getting enough milk is a common concern among mothers. You can be assured that your baby is getting enough milk if:   Your baby is actively sucking and you hear swallowing.   Your baby seems relaxed and satisfied after a feeding.   Your baby nurses at least 8 12 times in a 24 hour time period. Nurse your baby until he or she unlatches or falls asleep at the first breast (at least 10 20 minutes), then offer the second side.   Your baby is wetting 5 6 disposable diapers (6 8 cloth diapers) in a 24 hour period by 71 46 days of age.   Your baby is having at least 3 4 stools every 24 hours for the first 6 weeks. The stool should be soft and yellow.   Your baby should gain 4 7 ounces per week after he or she is 47 days old.   Your breasts feel softer after nursing.  REDUCING BREAST ENGORGEMENT  In the first week after your baby is born, you may experience signs of breast engorgement. When breasts are engorged, they feel heavy, warm, full, and may be tender to the touch. You can reduce engorgement if you:   Nurse frequently, every 2 3 hours. Mothers who breastfeed early and often have fewer problems with engorgement.   Place light ice packs on your breasts for 10 20 minutes between feedings. This reduces swelling. Wrap the ice packs in a lightweight towel to protect your skin. Bags of frozen vegetables work well for this purpose.   Take a warm shower or apply warm, moist heat to your breast for 5 10 minutes just before  each feeding. This increases circulation and helps the milk flow.   Gently massage your breast before and during the feeding. Using your finger tips, massage from the chest wall towards your nipple in a circular motion.   Make sure that the baby empties at least one breast at every feeding before switching sides.   Use a breast pump to empty the breasts if your baby is sleepy or not nursing well. You may also want to pump if you are returning to work oryou feel you are getting engorged.   Avoid bottle feeds, pacifiers, or supplemental feedings of water or juice in place of breastfeeding. Breast milk is all the food your baby needs. It is not necessary for your baby to have water or formula. In fact, to help your breasts make more milk, it is best not to give your baby supplemental feedings during the early weeks.   Be sure the baby is latched on and positioned properly while breastfeeding.   Wear a supportive bra, avoiding underwire styles.   Eat a balanced diet with enough fluids.   Rest often, relax, and take your prenatal vitamins to prevent fatigue, stress, and anemia.  If you follow these suggestions, your engorgement should improve in 24 48 hours. If you are still experiencing difficulty, call your lactation consultant or caregiver.  CARING FOR YOURSELF Take care of your  breasts  Bathe or shower daily.   Avoid using soap on your nipples.   Start feedings on your left breast at one feeding and on your right breast at the next feeding.   You will notice an increase in your milk supply 2 5 days after delivery. You may feel some discomfort from engorgement, which makes your breasts very firm and often tender. Engorgement "peaks" out within 24 48 hours. In the meantime, apply warm moist towels to your breasts for 5 10 minutes before feeding. Gentle massage and expression of some milk before feeding will soften your breasts, making it easier for your baby to latch on.    Wear a well-fitting nursing bra, and air dry your nipples for a 3 after each feeding.   Only use cotton bra pads.   Only use pure lanolin on your nipples after nursing. You do not need to wash it off before feeding the baby again. Another option is to express a few drops of breast milk and gently massage it into your nipples.  Take care of yourself  Eat well-balanced meals and nutritious snacks.   Drinking milk, fruit juice, and water to satisfy your thirst (about 8 glasses a day).   Get plenty of rest.  Avoid foods that you notice affect the baby in a bad way.  SEEK MEDICAL CARE IF:   You have difficulty with breastfeeding and need help.   You have a hard, red, sore area on your breast that is accompanied by a fever.   Your baby is too sleepy to eat well or is having trouble sleeping.   Your baby is wetting less than 6 diapers a day, by 30 days of age.   Your baby's skin or white part of his or her eyes is more yellow than it was in the hospital.   You feel depressed.  Document Released: 01/03/2005 Document Revised: 07/05/2011 Document Reviewed: 04/03/2011 [facility] Patient Information 2013 [place], [place]. Colposcopy Colposcopy is a procedure that uses a special lighted microscope (colposcope). It examines your cervix and vagina, or the area around the outside of the vagina, for signs of disease or abnormalities in the cells. You may be sent to a specialist (gynecologist) to do the colposcopy. A biopsy (tissue sample) may be collected during a colposcopy, if the caregiver finds any unusual cells. The biopsy is sent to the lab for further testing, and the results are reported back to your caregiver. A WOMAN MAY NEED THIS PROCEDURE IF:  She has had an abnormal pap smear (taking cells from the cervix for testing).  She has a sore on her cervix, and a Pap test was normal.  The Pap test suggests human papilloma virus (HPV). This virus can cause genital warts  and is linked to the development of cervical cancer.  She has genital warts on the cervix, or in or around the outside of the vagina.  Her mother took the drug DES while pregnant.  She has painful intercourse.  She has vaginal bleeding, especially after sexual intercourse.  There is a need to evaluate the results of previous treatment. BEFORE THE PROCEDURE   Colposcopy is done when you are not having a menstrual period.  For 24 hours before the colposcopy, do not:  Douche.  Use tampons.  Use medicines, creams, or suppositories in the vagina.  Have sexual intercourse. PROCEDURE   A colposcopy is done while a woman is lying on her back with her feet in foot rests (stirrups).  A  speculum is placed inside the vagina to keep it open and to allow the caregiver to see the cervix. This is the same instrument used to do a pap smear.  The colposcope is placed outside the vagina. It is used to magnify and examine the cervix, vagina, and the area around the outside of the vagina.  A small amount of liquid solution is placed on the area that is to be viewed. This solution is placed on with a cotton applicator. This solution makes it easier to see the abnormal cells.  Your caregiver will suck out mucus and cells from the canal of the cervix.  Small pieces of tissue for biopsy may be taken at the same time. You may feel mild pain or discomfort when this is done.  Your caregiver will record the location of the abnormal areas and send the tissue samples to a lab for analysis.  If your caregiver biopsies the vagina or outside of the vagina, a local anesthetic (novocaine) is usually given. AFTER THE PROCEDURE   You may have some cramping that often goes away in a few minutes. You may have some soreness for a couple of days.  You may take over-the-counter pain medicine as advised by your caregiver. Do not take aspirin because it can cause bleeding.  Lie down for a few minutes if you feel  lightheaded.  You may have some bleeding or dark discharge that should stop in a few days.  You may need to wear a sanitary pad for a few days. HOME CARE INSTRUCTIONS   Avoid sex, douching, and using tampons for a week or as directed.  Only take medicine as directed by your caregiver.  Continue to take birth control pills, if you are on them.  Not all test results are available during your visit. If your test results are not back during the visit, make an appointment with your caregiver to find out the results. Do not assume everything is normal if you have not heard from your caregiver or the medical facility. It is important for you to follow up on all of your test results.  Follow your caregiver's advice regarding medicines, activity, follow-up visits, and follow-up Pap tests. SEEK MEDICAL CARE IF:   You develop a rash.  You have problems with your medicine. SEEK IMMEDIATE MEDICAL CARE IF:  You are bleeding heavily or are passing blood clots.  You develop a fever over 102 F (38.9 C), with or without chills.  You have abnormal vaginal discharge.  You are having cramps that do not go away after taking your pain medicine.  You feel lightheaded, dizzy, or faint.  You develop stomach pain. Document Released: 03/26/2002 Document Revised: 03/28/2011 Document Reviewed: 11/06/2008 [facility] Patient Information 2013 [place], [place].

## 2012-05-31 NOTE — Progress Notes (Signed)
Pt. Is an 27 y.o. 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   Patient given informed consent, signed copy in the chart, time out was performed.  Placed in lithotomy position. Cervix viewed with speculum and colposcope after application of acetic acid.   Colposcopy adequate?  yes Acetowhite lesions?yes Punctation?yes Mosaicism?  yes Abnormal vasculature?  no Biopsies?2 o'clock ECC?no  COMMENTS: Patient was given post procedure instructions.  She will return in 2 weeks for results.

## 2012-06-04 ENCOUNTER — Encounter: Payer: Self-pay | Admitting: *Deleted

## 2012-06-05 ENCOUNTER — Encounter: Payer: Self-pay | Admitting: Family Medicine

## 2012-06-28 ENCOUNTER — Ambulatory Visit (INDEPENDENT_AMBULATORY_CARE_PROVIDER_SITE_OTHER): Payer: Medicaid Other | Admitting: Obstetrics and Gynecology

## 2012-06-28 ENCOUNTER — Encounter: Payer: Self-pay | Admitting: Obstetrics and Gynecology

## 2012-06-28 VITALS — BP 140/80 | Wt 245.7 lb

## 2012-06-28 DIAGNOSIS — O09292 Supervision of pregnancy with other poor reproductive or obstetric history, second trimester: Secondary | ICD-10-CM

## 2012-06-28 DIAGNOSIS — O0930 Supervision of pregnancy with insufficient antenatal care, unspecified trimester: Secondary | ICD-10-CM

## 2012-06-28 DIAGNOSIS — R6889 Other general symptoms and signs: Secondary | ICD-10-CM

## 2012-06-28 DIAGNOSIS — IMO0002 Reserved for concepts with insufficient information to code with codable children: Secondary | ICD-10-CM

## 2012-06-28 DIAGNOSIS — O09299 Supervision of pregnancy with other poor reproductive or obstetric history, unspecified trimester: Secondary | ICD-10-CM

## 2012-06-28 LAB — CBC
MCH: 28.9 pg (ref 26.0–34.0)
Platelets: 273 10*3/uL (ref 150–400)
RBC: 4.05 MIL/uL (ref 3.87–5.11)
RDW: 13.5 % (ref 11.5–15.5)
WBC: 6 10*3/uL (ref 4.0–10.5)

## 2012-06-28 LAB — POCT URINALYSIS DIP (DEVICE)
Bilirubin Urine: NEGATIVE
Nitrite: NEGATIVE
Urobilinogen, UA: 1 mg/dL (ref 0.0–1.0)
pH: 7.5 (ref 5.0–8.0)

## 2012-06-28 LAB — RPR

## 2012-06-28 NOTE — Progress Notes (Signed)
Patient doing well without complaints. FM/PTL precautions reviewed. 1hr GCT and labs today. Patient remains undecided on contraception. Patient with elevated BP today in comparison to previous visits. Will monitor closely

## 2012-06-28 NOTE — Progress Notes (Signed)
Pulse: 88 Pt complains of a lot of pressure.  28 week labs today.

## 2012-06-28 NOTE — Patient Instructions (Signed)
Contraception Choices  Contraception (birth control) is the use of any methods or devices to prevent pregnancy. Below are some methods to help avoid pregnancy.  HORMONAL METHODS   · Contraceptive implant. This is a thin, plastic tube containing progesterone hormone. It does not contain estrogen hormone. Your caregiver inserts the tube in the inner part of the upper arm. The tube can remain in place for up to 3 years. After 3 years, the implant must be removed. The implant prevents the ovaries from releasing an egg (ovulation), thickens the cervical mucus which prevents sperm from entering the uterus, and thins the lining of the inside of the uterus.  · Progesterone-only injections. These injections are given every 3 months by your caregiver to prevent pregnancy. This synthetic progesterone hormone stops the ovaries from releasing eggs. It also thickens cervical mucus and changes the uterine lining. This makes it harder for sperm to survive in the uterus.  · Birth control pills. These pills contain estrogen and progesterone hormone. They work by stopping the egg from forming in the ovary (ovulation). Birth control pills are prescribed by a caregiver. Birth control pills can also be used to treat heavy periods.  · Minipill. This type of birth control pill contains only the progesterone hormone. They are taken every day of each month and must be prescribed by your caregiver.  · Birth control patch. The patch contains hormones similar to those in birth control pills. It must be changed once a week and is prescribed by a caregiver.  · Vaginal ring. The ring contains hormones similar to those in birth control pills. It is left in the vagina for 3 weeks, removed for 1 week, and then a new one is put back in place. The patient must be comfortable inserting and removing the ring from the vagina. A caregiver's prescription is necessary.  · Emergency contraception. Emergency contraceptives prevent pregnancy after unprotected  sexual intercourse. This pill can be taken right after sex or up to 5 days after unprotected sex. It is most effective the sooner you take the pills after having sexual intercourse. Emergency contraceptive pills are available without a prescription. Check with your pharmacist. Do not use emergency contraception as your only form of birth control.  BARRIER METHODS   · Female condom. This is a thin sheath (latex or rubber) that is worn over the penis during sexual intercourse. It can be used with spermicide to increase effectiveness.  · Female condom. This is a soft, loose-fitting sheath that is put into the vagina before sexual intercourse.  · Diaphragm. This is a soft, latex, dome-shaped barrier that must be fitted by a caregiver. It is inserted into the vagina, along with a spermicidal jelly. It is inserted before intercourse. The diaphragm should be left in the vagina for 6 to 8 hours after intercourse.  · Cervical cap. This is a round, soft, latex or plastic cup that fits over the cervix and must be fitted by a caregiver. The cap can be left in place for up to 48 hours after intercourse.  · Sponge. This is a soft, circular piece of polyurethane foam. The sponge has spermicide in it. It is inserted into the vagina after wetting it and before sexual intercourse.  · Spermicides. These are chemicals that kill or block sperm from entering the cervix and uterus. They come in the form of creams, jellies, suppositories, foam, or tablets. They do not require a prescription. They are inserted into the vagina with an applicator before having sexual intercourse.   The process must be repeated every time you have sexual intercourse.  INTRAUTERINE CONTRACEPTION  · Intrauterine device (IUD). This is a T-shaped device that is put in a woman's uterus during a menstrual period to prevent pregnancy. There are 2 types:  · Copper IUD. This type of IUD is wrapped in copper wire and is placed inside the uterus. Copper makes the uterus and  fallopian tubes produce a fluid that kills sperm. It can stay in place for 10 years.  · Hormone IUD. This type of IUD contains the hormone progestin (synthetic progesterone). The hormone thickens the cervical mucus and prevents sperm from entering the uterus, and it also thins the uterine lining to prevent implantation of a fertilized egg. The hormone can weaken or kill the sperm that get into the uterus. It can stay in place for 5 years.  PERMANENT METHODS OF CONTRACEPTION  · Female tubal ligation. This is when the woman's fallopian tubes are surgically sealed, tied, or blocked to prevent the egg from traveling to the uterus.  · Female sterilization. This is when the female has the tubes that carry sperm tied off (vasectomy). This blocks sperm from entering the vagina during sexual intercourse. After the procedure, the man can still ejaculate fluid (semen).  NATURAL PLANNING METHODS  · Natural family planning. This is not having sexual intercourse or using a barrier method (condom, diaphragm, cervical cap) on days the woman could become pregnant.  · Calendar method. This is keeping track of the length of each menstrual cycle and identifying when you are fertile.  · Ovulation method. This is avoiding sexual intercourse during ovulation.  · Symptothermal method. This is avoiding sexual intercourse during ovulation, using a thermometer and ovulation symptoms.  · Post-ovulation method. This is timing sexual intercourse after you have ovulated.  Regardless of which type or method of contraception you choose, it is important that you use condoms to protect against the transmission of sexually transmitted diseases (STDs). Talk with your caregiver about which form of contraception is most appropriate for you.  Document Released: 01/03/2005 Document Revised: 03/28/2011 Document Reviewed: 05/12/2010  [employer]® Patient Information ©2014 [employer].

## 2012-06-30 ENCOUNTER — Encounter: Payer: Self-pay | Admitting: Obstetrics and Gynecology

## 2012-07-12 ENCOUNTER — Encounter: Payer: Self-pay | Admitting: Obstetrics and Gynecology

## 2012-07-12 ENCOUNTER — Ambulatory Visit (INDEPENDENT_AMBULATORY_CARE_PROVIDER_SITE_OTHER): Payer: Medicaid Other | Admitting: Obstetrics and Gynecology

## 2012-07-12 VITALS — BP 118/79 | Wt 243.3 lb

## 2012-07-12 DIAGNOSIS — O0930 Supervision of pregnancy with insufficient antenatal care, unspecified trimester: Secondary | ICD-10-CM

## 2012-07-12 DIAGNOSIS — R6889 Other general symptoms and signs: Secondary | ICD-10-CM

## 2012-07-12 DIAGNOSIS — O09299 Supervision of pregnancy with other poor reproductive or obstetric history, unspecified trimester: Secondary | ICD-10-CM

## 2012-07-12 DIAGNOSIS — IMO0002 Reserved for concepts with insufficient information to code with codable children: Secondary | ICD-10-CM

## 2012-07-12 DIAGNOSIS — O09293 Supervision of pregnancy with other poor reproductive or obstetric history, third trimester: Secondary | ICD-10-CM

## 2012-07-12 DIAGNOSIS — O0933 Supervision of pregnancy with insufficient antenatal care, third trimester: Secondary | ICD-10-CM

## 2012-07-12 LAB — POCT URINALYSIS DIP (DEVICE)
Ketones, ur: NEGATIVE mg/dL
Nitrite: NEGATIVE
Protein, ur: NEGATIVE mg/dL
Urobilinogen, UA: 1 mg/dL (ref 0.0–1.0)
pH: 7 (ref 5.0–8.0)

## 2012-07-12 NOTE — Progress Notes (Signed)
Patient doing well without complaints. FM/PTL precautions reviewed. Reviewed lab results with patient. Patient reports persistent pelvic pressure following completion of antibiotics and concerned that UTI may have returned. Will check urine culture today

## 2012-07-12 NOTE — Progress Notes (Signed)
Pulse: 86

## 2012-07-13 LAB — CULTURE, OB URINE: Colony Count: 25000

## 2012-07-26 ENCOUNTER — Ambulatory Visit (INDEPENDENT_AMBULATORY_CARE_PROVIDER_SITE_OTHER): Payer: Medicaid Other | Admitting: Obstetrics & Gynecology

## 2012-07-26 VITALS — BP 134/85 | Wt 243.0 lb

## 2012-07-26 DIAGNOSIS — K219 Gastro-esophageal reflux disease without esophagitis: Secondary | ICD-10-CM

## 2012-07-26 DIAGNOSIS — O0930 Supervision of pregnancy with insufficient antenatal care, unspecified trimester: Secondary | ICD-10-CM

## 2012-07-26 LAB — POCT URINALYSIS DIP (DEVICE)
Hgb urine dipstick: NEGATIVE
Ketones, ur: NEGATIVE mg/dL
Protein, ur: 30 mg/dL — AB
Specific Gravity, Urine: >=1.03 (ref 1.005–1.030)
Urobilinogen, UA: 1 mg/dL (ref 0.0–1.0)
pH: 6 (ref 5.0–8.0)

## 2012-07-26 MED ORDER — PANTOPRAZOLE SODIUM 40 MG PO TBEC: 40.0000 mg | DELAYED_RELEASE_TABLET | Freq: Every day | ORAL | Status: DC

## 2012-07-26 NOTE — Progress Notes (Signed)
No contractions, some upper back pain, frequent heartburn. Rx Protonix

## 2012-07-26 NOTE — Progress Notes (Signed)
Pulse 76  C/o increasing pelvic pressure.

## 2012-07-26 NOTE — Patient Instructions (Signed)

## 2012-08-09 ENCOUNTER — Ambulatory Visit (INDEPENDENT_AMBULATORY_CARE_PROVIDER_SITE_OTHER): Payer: Medicaid Other | Admitting: Family

## 2012-08-09 VITALS — BP 121/76 | Temp 97.0°F | Wt 243.0 lb

## 2012-08-09 DIAGNOSIS — O0932 Supervision of pregnancy with insufficient antenatal care, second trimester: Secondary | ICD-10-CM

## 2012-08-09 DIAGNOSIS — O0930 Supervision of pregnancy with insufficient antenatal care, unspecified trimester: Secondary | ICD-10-CM

## 2012-08-09 DIAGNOSIS — O09293 Supervision of pregnancy with other poor reproductive or obstetric history, third trimester: Secondary | ICD-10-CM

## 2012-08-09 DIAGNOSIS — O09299 Supervision of pregnancy with other poor reproductive or obstetric history, unspecified trimester: Secondary | ICD-10-CM

## 2012-08-09 LAB — POCT URINALYSIS DIP (DEVICE)
Hgb urine dipstick: NEGATIVE
Nitrite: NEGATIVE
Specific Gravity, Urine: 1.02 (ref 1.005–1.030)
Urobilinogen, UA: 2 mg/dL — ABNORMAL HIGH (ref 0.0–1.0)
pH: 7 (ref 5.0–8.0)

## 2012-08-09 LAB — CBC
MCH: 28.1 pg (ref 26.0–34.0)
MCHC: 33.7 g/dL (ref 30.0–36.0)
MCV: 83.3 fL (ref 78.0–100.0)
Platelets: 246 10*3/uL (ref 150–400)
RBC: 4.13 MIL/uL (ref 3.87–5.11)

## 2012-08-09 NOTE — Progress Notes (Signed)
Pelvic pressure; no bleeding or leaking; lightheaded > check CBC.

## 2012-08-09 NOTE — Progress Notes (Signed)
P=71,  c/o new pelvic pressure x 1 week, also c/o feeling lightheaded and seeing spots about a week. Declines tdap.

## 2012-08-23 ENCOUNTER — Ambulatory Visit (INDEPENDENT_AMBULATORY_CARE_PROVIDER_SITE_OTHER): Payer: Medicaid Other | Admitting: Family Medicine

## 2012-08-23 VITALS — BP 139/93 | Temp 97.4°F | Wt 243.5 lb

## 2012-08-23 DIAGNOSIS — O0933 Supervision of pregnancy with insufficient antenatal care, third trimester: Secondary | ICD-10-CM

## 2012-08-23 DIAGNOSIS — O0930 Supervision of pregnancy with insufficient antenatal care, unspecified trimester: Secondary | ICD-10-CM

## 2012-08-23 LAB — POCT URINALYSIS DIP (DEVICE)
Glucose, UA: NEGATIVE mg/dL
Hgb urine dipstick: NEGATIVE
Ketones, ur: NEGATIVE mg/dL
Protein, ur: NEGATIVE mg/dL
Specific Gravity, Urine: >=1.03 (ref 1.005–1.030)
Urobilinogen, UA: 0.2 mg/dL (ref 0.0–1.0)

## 2012-08-23 NOTE — Progress Notes (Signed)
Leakage of fluid?-- Watch BP, cultures next week.

## 2012-08-23 NOTE — Patient Instructions (Signed)
Pregnancy - Third Trimester The third trimester of pregnancy (the last 3 months) is a period of the most rapid growth for you and your baby. The baby approaches a length of 20 inches and a weight of 6 to 10 pounds. The baby is adding on fat and getting ready for life outside your body. While inside, babies have periods of sleeping and waking, sucking thumbs, and hiccuping. You can often feel small contractions of the uterus. This is false labor. It is also called Braxton-Hicks contractions. This is like a practice for labor. The usual problems in this stage of pregnancy include more difficulty breathing, swelling of the hands and feet from water retention, and having to urinate more often because of the uterus and baby pressing on your bladder.  PRENATAL EXAMS  Blood work may continue to be done during prenatal exams. These tests are done to check on your health and the probable health of your baby. Blood work is used to follow your blood levels (hemoglobin). Anemia (low hemoglobin) is common during pregnancy. Iron and vitamins are given to help prevent this. You may also continue to be checked for diabetes. Some of the past blood tests may be done again.  The size of the uterus is measured during each visit. This makes sure your baby is growing properly according to your pregnancy dates.  Your blood pressure is checked every prenatal visit. This is to make sure you are not getting toxemia.  Your urine is checked every prenatal visit for infection, diabetes, and protein.  Your weight is checked at each visit. This is done to make sure gains are happening at the suggested rate and that you and your baby are growing normally.  Sometimes, an ultrasound is performed to confirm the position and the proper growth and development of the baby. This is a test done that bounces harmless sound waves off the baby so your caregiver can more accurately determine a due date.  Discuss the type of pain medicine and  anesthesia you will have during your labor and delivery.  Discuss the possibility and anesthesia if a cesarean section might be necessary.  Inform your caregiver if there is any mental or physical violence at home. Sometimes, a specialized non-stress test, contraction stress test, and biophysical profile are done to make sure the baby is not having a problem. Checking the amniotic fluid surrounding the baby is called an amniocentesis. The amniotic fluid is removed by sticking a needle into the belly (abdomen). This is sometimes done near the end of pregnancy if an early delivery is required. In this case, it is done to help make sure the baby's lungs are mature enough for the baby to live outside of the womb. If the lungs are not mature and it is unsafe to deliver the baby, an injection of cortisone medicine is given to the mother 1 to 2 days before the delivery. This helps the baby's lungs mature and makes it safer to deliver the baby. CHANGES OCCURING IN THE THIRD TRIMESTER OF PREGNANCY Your body goes through many changes during pregnancy. They vary from person to person. Talk to your caregiver about changes you notice and are concerned about.  During the last trimester, you have probably had an increase in your appetite. It is normal to have cravings for certain foods. This varies from person to person and pregnancy to pregnancy.  You may begin to get stretch marks on your hips, abdomen, and breasts. These are normal changes in the body   during pregnancy. There are no exercises or medicines to take which prevent this change.  Constipation may be treated with a stool softener or adding bulk to your diet. Drinking lots of fluids, fiber in vegetables, fruits, and whole grains are helpful.  Exercising is also helpful. If you have been very active up until your pregnancy, most of these activities can be continued during your pregnancy. If you have been less active, it is helpful to start an exercise  program such as walking. Consult your caregiver before starting exercise programs.  Avoid all smoking, alcohol, non-prescribed drugs, herbs and "street drugs" during your pregnancy. These chemicals affect the formation and growth of the baby. Avoid chemicals throughout the pregnancy to ensure the delivery of a healthy infant.  Backache, varicose veins, and hemorrhoids may develop or get worse.  You will tire more easily in the third trimester, which is normal.  The baby's movements may be stronger and more often.  You may become short of breath easily.  Your belly button may stick out.  A yellow discharge may leak from your breasts called colostrum.  You may have a bloody mucus discharge. This usually occurs a few days to a week before labor begins. HOME CARE INSTRUCTIONS   Keep your caregiver's appointments. Follow your caregiver's instructions regarding medicine use, exercise, and diet.  During pregnancy, you are providing food for you and your baby. Continue to eat regular, well-balanced meals. Choose foods such as meat, fish, milk and other low fat dairy products, vegetables, fruits, and whole-grain breads and cereals. Your caregiver will tell you of the ideal weight gain.  A physical sexual relationship may be continued throughout pregnancy if there are no other problems such as early (premature) leaking of amniotic fluid from the membranes, vaginal bleeding, or belly (abdominal) pain.  Exercise regularly if there are no restrictions. Check with your caregiver if you are unsure of the safety of your exercises. Greater weight gain will occur in the last 2 trimesters of pregnancy. Exercising helps:  Control your weight.  Get you in shape for labor and delivery.  You lose weight after you deliver.  Rest a lot with legs elevated, or as needed for leg cramps or low back pain.  Wear a good support or jogging bra for breast tenderness during pregnancy. This may help if worn during  sleep. Pads or tissues may be used in the bra if you are leaking colostrum.  Do not use hot tubs, steam rooms, or saunas.  Wear your seat belt when driving. This protects you and your baby if you are in an accident.  Avoid raw meat, cat litter boxes and soil used by cats. These carry germs that can cause birth defects in the baby.  It is easier to leak urine during pregnancy. Tightening up and strengthening the pelvic muscles will help with this problem. You can practice stopping your urination while you are going to the bathroom. These are the same muscles you need to strengthen. It is also the muscles you would use if you were trying to stop from passing gas. You can practice tightening these muscles up 10 times a set and repeating this about 3 times per day. Once you know what muscles to tighten up, do not perform these exercises during urination. It is more likely to cause an infection by backing up the urine.  Ask for help if you have financial, counseling, or nutritional needs during pregnancy. Your caregiver will be able to offer counseling for these   needs as well as refer you for other special needs.  Make a list of emergency phone numbers and have them available.  Plan on getting help from family or friends when you go home from the hospital.  Make a trial run to the hospital.  Take prenatal classes with the father to understand, practice, and ask questions about the labor and delivery.  Prepare the baby's room or nursery.  Do not travel out of the city unless it is absolutely necessary and with the advice of your caregiver.  Wear only low or no heal shoes to have better balance and prevent falling. MEDICINES AND DRUG USE IN PREGNANCY  Take prenatal vitamins as directed. The vitamin should contain 1 milligram of folic acid. Keep all vitamins out of reach of children. Only a couple vitamins or tablets containing iron may be fatal to a baby or young child when ingested.  Avoid use  of all medicines, including herbs, over-the-counter medicines, not prescribed or suggested by your caregiver. Only take over-the-counter or prescription medicines for pain, discomfort, or fever as directed by your caregiver. Do not use aspirin, ibuprofen or naproxen unless approved by your caregiver.  Let your caregiver also know about herbs you may be using.  Alcohol is related to a number of birth defects. This includes fetal alcohol syndrome. All alcohol, in any form, should be avoided completely. Smoking will cause low birth rate and premature babies.  Illegal drugs are very harmful to the baby. They are absolutely forbidden. A baby born to an addicted mother will be addicted at birth. The baby will go through the same withdrawal an adult does. SEEK MEDICAL CARE IF: You have any concerns or worries during your pregnancy. It is better to call with your questions if you feel they cannot wait, rather than worry about them. SEEK IMMEDIATE MEDICAL CARE IF:   An unexplained oral temperature above 102 F (38.9 C) develops, or as your caregiver suggests.  You have leaking of fluid from the vagina. If leaking membranes are suspected, take your temperature and tell your caregiver of this when you call.  There is vaginal spotting, bleeding or passing clots. Tell your caregiver of the amount and how many pads are used.  You develop a bad smelling vaginal discharge with a change in the color from clear to white.  You develop vomiting that lasts more than 24 hours.  You develop chills or fever.  You develop shortness of breath.  You develop burning on urination.  You loose more than 2 pounds of weight or gain more than 2 pounds of weight or as suggested by your caregiver.  You notice sudden swelling of your face, hands, and feet or legs.  You develop belly (abdominal) pain. Round ligament discomfort is a common non-cancerous (benign) cause of abdominal pain in pregnancy. Your caregiver still  must evaluate you.  You develop a severe headache that does not go away.  You develop visual problems, blurred or double vision.  If you have not felt your baby move for more than 1 hour. If you think the baby is not moving as much as usual, eat something with sugar in it and lie down on your left side for an hour. The baby should move at least 4 to 5 times per hour. Call right away if your baby moves less than that.  You fall, are in a car accident, or any kind of trauma.  There is mental or physical violence at home. Document Released: 12/28/2000   Document Revised: 09/28/2011 Document Reviewed: 07/02/2008 [employer] Patient Information 2014 [employer].  Breastfeeding A change in hormones during your pregnancy causes growth of your breast tissue and an increase in number and size of milk ducts. The hormone prolactin allows proteins, sugars, and fats from your blood supply to make breast milk in your milk-producing glands. The hormone progesterone prevents breast milk from being released before the birth of your baby. After the birth of your baby, your progesterone level decreases allowing breast milk to be released. Thoughts of your baby, as well as his or her sucking or crying, can stimulate the release of milk from the milk-producing glands. Deciding to breastfeed (nurse) is one of the best choices you can make for you and your baby. The information that follows gives a brief review of the benefits, as well as other important skills to know about breastfeeding. BENEFITS OF BREASTFEEDING For your baby  The first milk (colostrum) helps your baby's digestive system function better.   There are antibodies in your milk that help your baby fight off infections.   Your baby has a lower incidence of asthma, allergies, and sudden infant death syndrome (SIDS).   The nutrients in breast milk are better for your baby than infant formulas.  Breast milk improves your baby's brain development.    Your baby will have less gas, colic, and constipation.  Your baby is less likely to develop other conditions, such as childhood obesity, asthma, or diabetes mellitus. For you  Breastfeeding helps develop a very special bond between you and your baby.   Breastfeeding is convenient, always available at the correct temperature, and costs nothing.   Breastfeeding helps to burn calories and helps you lose the weight gained during pregnancy.   Breastfeeding makes your uterus contract back down to normal size faster and slows bleeding following delivery.   Breastfeeding mothers have a lower risk of developing osteoporosis or breast or ovarian cancer later in life.  BREASTFEEDING FREQUENCY  A healthy, full-term baby may breastfeed as often as every hour or space his or her feedings to every 3 hours. Breastfeeding frequency will vary from baby to baby.   Newborns should be fed no less than every 2 3 hours during the day and every 4 5 hours during the night. You should breastfeed a minimum of 8 feedings in a 24 hour period.  Awaken your baby to breastfeed if it has been 3 4 hours since the last feeding.  Breastfeed when you feel the need to reduce the fullness of your breasts or when your newborn shows signs of hunger. Signs that your baby may be hungry include:  Increased alertness or activity.  Stretching.  Movement of the head from side to side.  Movement of the head and opening of the mouth when the corner of the mouth or cheek is stroked (rooting).  Increased sucking sounds, smacking lips, cooing, sighing, or squeaking.  Hand-to-mouth movements.  Increased sucking of fingers or hands.  Fussing.  Intermittent crying.  Signs of extreme hunger will require calming and consoling before you try to feed your baby. Signs of extreme hunger may include:  Restlessness.  A loud, strong cry.  Screaming.  Frequent feeding will help you make more milk and will help prevent  problems, such as sore nipples and engorgement of the breasts.  BREASTFEEDING   Whether lying down or sitting, be sure that the baby's abdomen is facing your abdomen.   Support your breast with 4 fingers under your breast   and your thumb above your nipple. Make sure your fingers are well away from your nipple and your baby's mouth.   Stroke your baby's lips gently with your finger or nipple.   When your baby's mouth is open wide enough, place all of your nipple and as much of the colored area around your nipple (areola) as possible into your baby's mouth.  More areola should be visible above his or her upper lip than below his or her lower lip.  Your baby's tongue should be between his or her lower gum and your breast.  Ensure that your baby's mouth is correctly positioned around the nipple (latched). Your baby's lips should create a seal on your breast.  Signs that your baby has effectively latched onto your nipple include:  Tugging or sucking without pain.  Swallowing heard between sucks.  Absent click or smacking sound.  Muscle movement above and in front of his or her ears with sucking.  Your baby must suck about 2 3 minutes in order to get your milk. Allow your baby to feed on each breast as long as he or she wants. Nurse your baby until he or she unlatches or falls asleep at the first breast, then offer the second breast.  Signs that your baby is full and satisfied include:  A gradual decrease in the number of sucks or complete cessation of sucking.  Falling asleep.  Extension or relaxation of his or her body.  Retention of a small amount of milk in his or her mouth.  Letting go of your breast by himself or herself.  Signs of effective breastfeeding in you include:  Breasts that have increased firmness, weight, and size prior to feeding.  Breasts that are softer after nursing.  Increased milk volume, as well as a change in milk consistency and color by the 5th  day of breastfeeding.  Breast fullness relieved by breastfeeding.  Nipples are not sore, cracked, or bleeding.  If needed, break the suction by putting your finger into the corner of your baby's mouth and sliding your finger between his or her gums. Then, remove your breast from his or her mouth.  It is common for babies to spit up a small amount after a feeding.  Babies often swallow air during feeding. This can make babies fussy. Burping your baby between breasts can help with this.  Vitamin D supplements are recommended for babies who get only breast milk.  Avoid using a pacifier during your baby's first 4 6 weeks.  Avoid supplemental feedings of water, formula, or juice in place of breastfeeding. Breast milk is all the food your baby needs. It is not necessary for your baby to have water or formula. Your breasts will make more milk if supplemental feedings are avoided during the early weeks. HOW TO TELL WHETHER YOUR BABY IS GETTING ENOUGH BREAST MILK Wondering whether or not your baby is getting enough milk is a common concern among mothers. You can be assured that your baby is getting enough milk if:   Your baby is actively sucking and you hear swallowing.   Your baby seems relaxed and satisfied after a feeding.   Your baby nurses at least 8 12 times in a 24 hour time period.  During the first 3 5 days of age:  Your baby is wetting at least 3 5 diapers in a 24 hour period. The urine should be clear and pale yellow.  Your baby is having at least 3 4 stools in   a 24 hour period. The stool should be soft and yellow.  At 5 7 days of age, your baby is having at least 3 6 stools in a 24 hour period. The stool should be seedy and yellow by 5 days of age.  Your baby has a weight loss less than 7 10% during the first 3 days of age.  Your baby does not lose weight after 3 7 days of age.  Your baby gains 4 7 ounces each week after he or she is 4 days of age.  Your baby gains weight  by 5 days of age and is back to birth weight within 2 weeks. ENGORGEMENT In the first week after your baby is born, you may experience extremely full breasts (engorgement). When engorged, your breasts may feel heavy, warm, or tender to the touch. Engorgement peaks within 24 48 hours after delivery of your baby.  Engorgement may be reduced by:  Continuing to breastfeed.  Increasing the frequency of breastfeeding.  Taking warm showers or applying warm, moist heat to your breasts just before each feeding. This increases circulation and helps the milk flow.   Gently massaging your breast before and during the feedings. With your fingertips, massage from your chest wall towards your nipple in a circular motion.   Ensuring that your baby empties at least one breast at every feeding. It also helps to start the next feeding on the opposite breast.   Expressing breast milk by hand or by using a breast pump to empty the breasts if your baby is sleepy, or not nursing well. You may also want to express milk if you are returning to work oryou feel you are getting engorged.  Ensuring your baby is latched on and positioned properly while breastfeeding. If you follow these suggestions, your engorgement should improve in 24 48 hours. If you are still experiencing difficulty, call your lactation consultant or caregiver.  CARING FOR YOURSELF Take care of your breasts.  Bathe or shower daily.   Avoid using soap on your nipples.   Wear a supportive bra. Avoid wearing underwire style bras.  Air dry your nipples for a 3 4minutes after each feeding.   Use only cotton bra pads to absorb breast milk leakage. Leaking of breast milk between feedings is normal.   Use only pure lanolin on your nipples after nursing. You do not need to wash it off before feeding your baby again. Another option is to express a few drops of breast milk and gently massage that milk into your nipples.  Continue breast  self-awareness checks. Take care of yourself.  Eat healthy foods. Alternate 3 meals with 3 snacks.  Avoid foods that you notice affect your baby in a bad way.  Drink milk, fruit juice, and water to satisfy your thirst (about 8 glasses a day).   Rest often, relax, and take your prenatal vitamins to prevent fatigue, stress, and anemia.  Avoid chewing and smoking tobacco.  Avoid alcohol and drug use.  Take over-the-counter and prescribed medicine only as directed by your caregiver or pharmacist. You should always check with your caregiver or pharmacist before taking any new medicine, vitamin, or herbal supplement.  Know that pregnancy is possible while breastfeeding. If desired, talk to your caregiver about family planning and safe birth control methods that may be used while breastfeeding. SEEK MEDICAL CARE IF:   You feel like you want to stop breastfeeding or have become frustrated with breastfeeding.  You have painful breasts or nipples.    Your nipples are cracked or bleeding.  Your breasts are red, tender, or warm.  You have a swollen area on either breast.  You have a fever or chills.  You have nausea or vomiting.  You have drainage from your nipples.  Your breasts do not become full before feedings by the 5th day after delivery.  You feel sad and depressed.  Your baby is too sleepy to eat well.  Your baby is having trouble sleeping.   Your baby is wetting less than 3 diapers in a 24 hour period.  Your baby has less than 3 stools in a 24 hour period.  Your baby's skin or the white part of his or her eyes becomes more yellow.   Your baby is not gaining weight by 5 days of age. MAKE SURE YOU:   Understand these instructions.  Will watch your condition.  Will get help right away if you are not doing well or get worse. Document Released: 01/03/2005 Document Revised: 09/28/2011 Document Reviewed: 08/10/2011 [employer] Patient Information 2014 [employer].  

## 2012-08-23 NOTE — Progress Notes (Signed)
P = 80  Pt reports 2 episodes of clear, watery d/c within the last 4 days.

## 2012-08-24 LAB — WET PREP, GENITAL: Yeast Wet Prep HPF POC: NONE SEEN

## 2012-08-30 ENCOUNTER — Encounter: Payer: Self-pay | Admitting: Obstetrics & Gynecology

## 2012-08-30 ENCOUNTER — Ambulatory Visit (INDEPENDENT_AMBULATORY_CARE_PROVIDER_SITE_OTHER): Payer: Medicaid Other | Admitting: Obstetrics & Gynecology

## 2012-08-30 VITALS — BP 118/83 | Wt 243.4 lb

## 2012-08-30 DIAGNOSIS — K219 Gastro-esophageal reflux disease without esophagitis: Secondary | ICD-10-CM

## 2012-08-30 LAB — POCT URINALYSIS DIP (DEVICE)
Glucose, UA: 100 mg/dL — AB
Nitrite: NEGATIVE
pH: 6 (ref 5.0–8.0)

## 2012-08-30 LAB — OB RESULTS CONSOLE GC/CHLAMYDIA
Chlamydia: NEGATIVE
Gonorrhea: NEGATIVE

## 2012-08-30 LAB — OB RESULTS CONSOLE GBS: GBS: NEGATIVE

## 2012-08-30 MED ORDER — PANTOPRAZOLE SODIUM 40 MG PO TBEC: 40.0000 mg | DELAYED_RELEASE_TABLET | Freq: Every day | ORAL | Status: DC

## 2012-08-30 NOTE — Patient Instructions (Signed)
Vaginal Delivery  Your caregiver must first be sure you are in labor. Signs of labor include:   You may pass what is called "the mucus plug" before labor begins. This is a small amount of blood stained mucus.   Regular uterine contractions.   The time between contractions get closer together.   The discomfort and pain gradually gets more intense.   Pains are mostly located in the back.   Pains get worse when walking.   The cervix (the opening of the uterus) becomes thinner (begins to efface) and opens up (dilates).  Once you are in labor and admitted into the hospital or care center, your caregiver will do the following:   A complete physical examination.   Check your vital signs (blood pressure, pulse, temperature and the fetal heart rate).   Do a vaginal examination (using a sterile glove and lubricant) to determine:   The position (presentation) of the baby (head [vertex] or buttock first).   The level (station) of the baby's head in the birth canal.   The effacement and dilatation of the cervix.   You may have your pubic hair shaved and be given an enema depending on your caregiver and the circumstance.   An electronic monitor is usually placed on your abdomen. The monitor follows the length and intensity of the contractions, as well as the baby's heart rate.   Usually, your caregiver will insert an IV in your arm with a bottle of sugar water. This is done as a precaution so that medications can be given to you quickly during labor or delivery.  NORMAL LABOR AND DELIVERY IS DIVIDED UP INTO 3 STAGES:  First Stage  This is when regular contractions begin and the cervix begins to efface and dilate. This stage can last from 3 to 15 hours. The end of the first stage is when the cervix is 100% effaced and 10 centimeters dilated. Pain medications may be given by    Injection (morphine, demerol, etc.)    Regional anesthesia (spinal, caudal or epidural, anesthetics given in different locations of the spine). Paracervical pain medication may be given, which is an injection of and anesthetic on each side of the cervix.  A pregnant woman may request to have "Natural Childbirth" which is not to have any medications or anesthesia during her labor and delivery.  Second Stage  This is when the baby comes down through the birth canal (vagina) and is born. This can take 1 to 4 hours. As the baby's head comes down through the birth canal, you may feel like you are going to have a bowel movement. You will get the urge to bear down and push until the baby is delivered. As the baby's head is being delivered, the caregiver will decide if an episiotomy (a cut in the perineum and vagina area) is needed to prevent tearing of the tissue in this area. The episiotomy is sewn up after the delivery of the baby and placenta. Sometimes a mask with nitrous oxide is given for the mother to breath during the delivery of the baby to help if there is too much pain. The end of Stage 2 is when the baby is fully delivered. Then when the umbilical cord stops pulsating it is clamped and cut.  Third Stage  The third stage begins after the baby is completely delivered and ends after the placenta (afterbirth) is delivered. This usually takes 5 to 30 minutes. After the placenta is delivered, a medication is given   either by intravenous or injection to help contract the uterus and prevent bleeding. The third stage is not painful and pain medication is usually not necessary. If an episiotomy was done, it is repaired at this time.  After the delivery, the mother is watched and monitored closely for 1 to 2 hours to make sure there is no postpartum bleeding (hemorrhage). If there is a lot of bleeding, medication is given to contract the uterus and stop the bleeding.  Document Released: 10/13/2007 Document Revised: 09/28/2011 Document Reviewed: 10/13/2007   [employer] Patient Information 2014 [employer].

## 2012-08-30 NOTE — Progress Notes (Signed)
P=88, 99.3

## 2012-08-30 NOTE — Progress Notes (Signed)
Still reflux and nausea, will try 2 Protonix daily. GBS and GC CT today

## 2012-09-01 LAB — GC/CHLAMYDIA PROBE AMP
CT Probe RNA: NEGATIVE
GC Probe RNA: NEGATIVE

## 2012-09-06 ENCOUNTER — Ambulatory Visit (INDEPENDENT_AMBULATORY_CARE_PROVIDER_SITE_OTHER): Payer: Medicaid Other | Admitting: Obstetrics & Gynecology

## 2012-09-06 VITALS — BP 118/81 | Temp 97.1°F | Wt 244.8 lb

## 2012-09-06 DIAGNOSIS — R6889 Other general symptoms and signs: Secondary | ICD-10-CM

## 2012-09-06 DIAGNOSIS — IMO0002 Reserved for concepts with insufficient information to code with codable children: Secondary | ICD-10-CM

## 2012-09-06 DIAGNOSIS — O09293 Supervision of pregnancy with other poor reproductive or obstetric history, third trimester: Secondary | ICD-10-CM

## 2012-09-06 DIAGNOSIS — O09299 Supervision of pregnancy with other poor reproductive or obstetric history, unspecified trimester: Secondary | ICD-10-CM

## 2012-09-06 LAB — POCT URINALYSIS DIP (DEVICE)
Bilirubin Urine: NEGATIVE
Ketones, ur: NEGATIVE mg/dL
Specific Gravity, Urine: >=1.03 (ref 1.005–1.030)

## 2012-09-06 NOTE — Progress Notes (Signed)
No complaints. H/O SGA infant, will do f/u US. Reflux much improved

## 2012-09-06 NOTE — Patient Instructions (Addendum)

## 2012-09-06 NOTE — Progress Notes (Signed)
P=88,  

## 2012-09-06 NOTE — Progress Notes (Signed)
U/S scheduled 09/11/12 at 1045 am.

## 2012-09-11 ENCOUNTER — Encounter (HOSPITAL_COMMUNITY): Payer: Self-pay | Admitting: *Deleted

## 2012-09-11 ENCOUNTER — Inpatient Hospital Stay (HOSPITAL_COMMUNITY)
Admission: AD | Admit: 2012-09-11 | Discharge: 2012-09-14 | DRG: 775 | Disposition: A | Discharge status: Home / Self Care | Payer: Medicaid Other | Source: Ambulatory Visit | Attending: Obstetrics and Gynecology | Admitting: Obstetrics and Gynecology

## 2012-09-11 ENCOUNTER — Encounter (HOSPITAL_COMMUNITY): Payer: Self-pay | Admitting: Anesthesiology

## 2012-09-11 ENCOUNTER — Ambulatory Visit (HOSPITAL_COMMUNITY)
Admission: RE | Admit: 2012-09-11 | Discharge: 2012-09-11 | Disposition: A | Discharge status: Home / Self Care | Payer: Medicaid Other | Source: Ambulatory Visit | Attending: Obstetrics & Gynecology | Admitting: Obstetrics & Gynecology

## 2012-09-11 ENCOUNTER — Inpatient Hospital Stay (HOSPITAL_COMMUNITY): Payer: Medicaid Other | Admitting: Anesthesiology

## 2012-09-11 DIAGNOSIS — O4100X0 Oligohydramnios, unspecified trimester, not applicable or unspecified: Principal | ICD-10-CM | POA: Diagnosis present

## 2012-09-11 DIAGNOSIS — O4103X1 Oligohydramnios, third trimester, fetus 1: Secondary | ICD-10-CM

## 2012-09-11 DIAGNOSIS — O09293 Supervision of pregnancy with other poor reproductive or obstetric history, third trimester: Secondary | ICD-10-CM

## 2012-09-11 HISTORY — DX: Headache: R51

## 2012-09-11 LAB — TYPE AND SCREEN: Antibody Screen: NEGATIVE

## 2012-09-11 LAB — CBC
Hemoglobin: 12.4 g/dL (ref 12.0–15.0)
MCHC: 33.7 g/dL (ref 30.0–36.0)
Platelets: 239 10*3/uL (ref 150–400)
RBC: 4.42 MIL/uL (ref 3.87–5.11)

## 2012-09-11 MED ORDER — EPHEDRINE 5 MG/ML INJ
10.0000 mg | INTRAVENOUS | Status: DC | PRN
  Filled 2012-09-11: qty 4
  Filled 2012-09-11: qty 2

## 2012-09-11 MED ORDER — OXYTOCIN 40 UNITS IN LACTATED RINGERS INFUSION - SIMPLE MED
62.5000 mL/h | INTRAVENOUS | Status: DC
  Filled 2012-09-11: qty 1000

## 2012-09-11 MED ORDER — FLEET ENEMA 7-19 GM/118ML RE ENEM: 1.0000 | ENEMA | RECTAL | Status: DC | PRN

## 2012-09-11 MED ORDER — EPHEDRINE 5 MG/ML INJ
10.0000 mg | INTRAVENOUS | Status: DC | PRN
  Filled 2012-09-11: qty 2

## 2012-09-11 MED ORDER — LIDOCAINE HCL (PF) 1 % IJ SOLN
30.0000 mL | INTRAMUSCULAR | Status: DC | PRN
  Filled 2012-09-11 (×2): qty 30

## 2012-09-11 MED ORDER — OXYTOCIN 40 UNITS IN LACTATED RINGERS INFUSION - SIMPLE MED
1.0000 m[IU]/min | INTRAVENOUS | Status: DC
  Administered 2012-09-11: 2 m[IU]/min via INTRAVENOUS
  Filled 2012-09-11: qty 1000

## 2012-09-11 MED ORDER — LACTATED RINGERS IV SOLN
INTRAVENOUS | Status: DC
  Administered 2012-09-11 – 2012-09-12 (×3): via INTRAVENOUS

## 2012-09-11 MED ORDER — LIDOCAINE HCL (PF) 1 % IJ SOLN
INTRAMUSCULAR | Status: DC | PRN
  Administered 2012-09-11 (×4): 4 mL

## 2012-09-11 MED ORDER — OXYCODONE-ACETAMINOPHEN 5-325 MG PO TABS: 1.0000 | ORAL_TABLET | ORAL | Status: DC | PRN

## 2012-09-11 MED ORDER — FENTANYL 2.5 MCG/ML BUPIVACAINE 1/10 % EPIDURAL INFUSION (WH - ANES)
14.0000 mL/h | INTRAMUSCULAR | Status: DC | PRN
  Administered 2012-09-11 – 2012-09-12 (×2): 14 mL/h via EPIDURAL
  Filled 2012-09-11 (×2): qty 125

## 2012-09-11 MED ORDER — ONDANSETRON HCL 4 MG/2ML IJ SOLN
4.0000 mg | Freq: Four times a day (QID) | INTRAMUSCULAR | Status: DC | PRN
  Administered 2012-09-11: 4 mg via INTRAVENOUS
  Filled 2012-09-11: qty 2

## 2012-09-11 MED ORDER — DIPHENHYDRAMINE HCL 50 MG/ML IJ SOLN
12.5000 mg | INTRAMUSCULAR | Status: DC | PRN
  Administered 2012-09-12: 12.5 mg via INTRAVENOUS
  Filled 2012-09-11: qty 1

## 2012-09-11 MED ORDER — LACTATED RINGERS IV SOLN
500.0000 mL | Freq: Once | INTRAVENOUS | Status: AC
  Administered 2012-09-11: 500 mL via INTRAVENOUS

## 2012-09-11 MED ORDER — TERBUTALINE SULFATE 1 MG/ML IJ SOLN: 0.2500 mg | Freq: Once | INTRAMUSCULAR | Status: AC | PRN

## 2012-09-11 MED ORDER — LACTATED RINGERS IV SOLN: 500.0000 mL | INTRAVENOUS | Status: DC | PRN

## 2012-09-11 MED ORDER — PHENYLEPHRINE 40 MCG/ML (10ML) SYRINGE FOR IV PUSH (FOR BLOOD PRESSURE SUPPORT)
80.0000 ug | PREFILLED_SYRINGE | INTRAVENOUS | Status: DC | PRN
  Filled 2012-09-11: qty 2

## 2012-09-11 MED ORDER — ACETAMINOPHEN 325 MG PO TABS: 650.0000 mg | ORAL_TABLET | ORAL | Status: DC | PRN

## 2012-09-11 MED ORDER — IBUPROFEN 600 MG PO TABS: 600.0000 mg | ORAL_TABLET | Freq: Four times a day (QID) | ORAL | Status: DC | PRN

## 2012-09-11 MED ORDER — OXYTOCIN BOLUS FROM INFUSION
500.0000 mL | INTRAVENOUS | Status: DC
  Administered 2012-09-12: 500 mL via INTRAVENOUS

## 2012-09-11 MED ORDER — PHENYLEPHRINE 40 MCG/ML (10ML) SYRINGE FOR IV PUSH (FOR BLOOD PRESSURE SUPPORT)
80.0000 ug | PREFILLED_SYRINGE | INTRAVENOUS | Status: DC | PRN
  Filled 2012-09-11: qty 5
  Filled 2012-09-11: qty 2

## 2012-09-11 MED ORDER — CITRIC ACID-SODIUM CITRATE 334-500 MG/5ML PO SOLN: 30.0000 mL | ORAL | Status: DC | PRN

## 2012-09-11 NOTE — Anesthesia Procedure Notes (Signed)
Epidural Patient location during procedure: OB Start time: 09/11/2012 8:33 PM  Staffing Performed by: anesthesiologist   Preanesthetic Checklist Completed: patient identified, site marked, surgical consent, pre-op evaluation, timeout performed, IV checked, risks and benefits discussed and monitors and equipment checked  Epidural Patient position: sitting Prep: site prepped and draped and DuraPrep Patient monitoring: continuous pulse ox and blood pressure Approach: midline Injection technique: LOR air  Needle:  Needle type: Tuohy  Needle gauge: 17 G Needle length: 9 cm and 9 Needle insertion depth: 8 cm Catheter type: closed end flexible Catheter size: 19 Gauge Catheter at skin depth: 13 cm Test dose: negative  Assessment Events: blood not aspirated, injection not painful, no injection resistance, negative IV test and no paresthesia  Additional Notes Discussed risk of headache, infection, bleeding, nerve injury and failed or incomplete block.  Patient voices understanding and wishes to proceed.  Epidural placed easily on first attempt.  No paresthesia.  Patient tolerated procedure well with no apparent complications.  Jasmine December, MD Reason for block:procedure for pain

## 2012-09-11 NOTE — Anesthesia Preprocedure Evaluation (Signed)
Anesthesia Evaluation  Patient identified by MRN, date of birth, ID band Patient awake    Reviewed: Allergy & Precautions, H&P , NPO status , Patient's Chart, lab work & pertinent test results, reviewed documented beta blocker date and time   History of Anesthesia Complications Negative for: history of anesthetic complications  Airway Mallampati: I TM Distance: >3 FB Neck ROM: full    Dental  (+) Teeth Intact   Pulmonary neg pulmonary ROS,  breath sounds clear to auscultation        Cardiovascular negative cardio ROS  Rhythm:regular Rate:Normal     Neuro/Psych  Headaches (daily headaches), negative psych ROS   GI/Hepatic negative GI ROS, Neg liver ROS,   Endo/Other  negative endocrine ROS  Renal/GU negative Renal ROS  negative genitourinary   Musculoskeletal   Abdominal   Peds  Hematology negative hematology ROS (+)   Anesthesia Other Findings   Reproductive/Obstetrics (+) Pregnancy                           Anesthesia Physical Anesthesia Plan  ASA: II  Anesthesia Plan: Epidural   Post-op Pain Management:    Induction:   Airway Management Planned:   Additional Equipment:   Intra-op Plan:   Post-operative Plan:   Informed Consent: I have reviewed the patients History and Physical, chart, labs and discussed the procedure including the risks, benefits and alternatives for the proposed anesthesia with the patient or authorized representative who has indicated his/her understanding and acceptance.     Plan Discussed with:   Anesthesia Plan Comments:         Anesthesia Quick Evaluation

## 2012-09-11 NOTE — H&P (Signed)
Donna Thomas is a 27 y.o. female presenting for IOL due to oligiohydramnios. Had a scheduled appointment today and had an ultrasound reveiling low AFI.Marland Kitchen FM+ many times per hour. Was not feeling contractions. Maternal Medical History:  Reason for admission: Nausea.    OB History   Grav Para Term Preterm Abortions TAB SAB Ect Mult Living   2 1 1       1      Past Medical History  Diagnosis Date  . Infection   . Headache(784.0)    History reviewed. No pertinent past surgical history. Family History: family history includes Cancer in her maternal grandmother; Diabetes in her maternal grandmother. Social History:  reports that she has never smoked. She has never used smokeless tobacco. She reports that she does not drink alcohol or use illicit drugs.   Prenatal Transfer Tool  Maternal Diabetes: No Genetic Screening: Too late Maternal Ultrasounds/Referrals: Abnormal:  Findings:   Other:Oligohydramnios Fetal Ultrasounds or other Referrals:  None Maternal Substance Abuse:  No Significant Maternal Medications:  None Significant Maternal Lab Results:  None Other Comments:  None  Review of Systems  Constitutional: Negative for fever and chills.  Eyes: Negative for blurred vision.  Gastrointestinal: Negative for nausea, vomiting, diarrhea and constipation.       No RUQ pain  Neurological: Negative for headaches.      Blood pressure 140/80, pulse 90, temperature 98.2 F (36.8 C), temperature source Oral, resp. rate 20, height 5\' 6"  (1.676 m), weight 110.678 kg (244 lb), last menstrual period 02/12/2012. Exam Physical Exam  Constitutional: She is oriented to person, place, and time. She appears well-developed and well-nourished. She appears distressed.  Cardiovascular: Normal rate, regular rhythm and normal heart sounds.   Respiratory: Effort normal and breath sounds normal. No respiratory distress.  GI: There is no tenderness.  Musculoskeletal: She exhibits no edema and no  tenderness.  Neurological: She is alert and oriented to person, place, and time.  Skin: Skin is warm. She is not diaphoretic.    Prenatal labs: ABO, Rh: A/POS/-- (04/08 1053) Antibody: NEG (04/08 1053) Rubella: 4.05 (04/08 1053) RPR: NON REAC (06/12 1048)  HBsAg: NEGATIVE (04/08 1053)  HIV: NON REACTIVE (06/12 1048)  GBS: Negative (08/14 0000)  1 hour Glucose: 98  Assessment/Plan:  27 yo G2P1 at [redacted]w[redacted]d admitted for IOL due to oligohydramnios of a term pregnancy  Induction of labor, favorable cervix - admit to L&D birthing suite - admitting attending physician, Dr. Jolayne Panther - routine orders - pitocin 2x2  Jacquelin Hawking, MD 09/11/2012, 1:22 PM  Evaluation and management procedures were performed by Resident physician under my supervision/collaboration. Chart reviewed, patient examined by me and I agree with management and plan.

## 2012-09-11 NOTE — Progress Notes (Signed)
Pt c/o nausea, Zofran IV given per prn orders

## 2012-09-11 NOTE — Progress Notes (Signed)
Donna Thomas is a 27 y.o. G2P1001 at [redacted]w[redacted]d admitted for induction of labor due to Low amniotic fluid..  Subjective:  Having painful contractions  Objective: BP 135/88  Pulse 73  Temp(Src) 98.2 F (36.8 C) (Oral)  Resp 18  Ht 5\' 6"  (1.676 m)  Wt 110.678 kg (244 lb)  BMI 39.4 kg/m2  LMP 02/12/2012      FHT:  FHR: 155 bpm, variability: moderate,  accelerations:  Present,  decelerations:  Absent UC:   regular, every 3-4 minutes SVE:   Dilation: 3.5 Effacement (%): 70 Station: -2;-1 Exam by:: Coralie Keens   Labs: Lab Results  Component Value Date   WBC 6.4 09/11/2012   HGB 12.4 09/11/2012   HCT 36.8 09/11/2012   MCV 83.3 09/11/2012   PLT 239 09/11/2012    Assessment / Plan: Induction of labor due to oligohydramnios,  progressing well on pitocin  Labor: Progressing on Pitocin, will continue to increase Preeclampsia:  n/a Fetal Wellbeing:  Category I Pain Control:  Labor support without medications I/D:  n/a Anticipated MOD:  NSVD  Jacquelin Hawking, MD 09/11/2012, 4:43 PM

## 2012-09-11 NOTE — Progress Notes (Signed)
Sarajean MARCHA LICKLIDER is a 27 y.o. G2P1001 at [redacted]w[redacted]d    Subjective: Comfortable with epidural  Objective: BP 133/86  Pulse 93  Temp(Src) 98.6 F (37 C) (Oral)  Resp 18  Ht 5\' 6"  (1.676 m)  Wt 110.678 kg (244 lb)  BMI 39.4 kg/m2  SpO2 100%  LMP 02/12/2012      FHT:  FHR: 140 bpm, variability: moderate,  accelerations:  Present,  decelerations:  Absent UC:   irregular, every 1-3 minutes SVE:   Dilation: 5 Effacement (%): 80 Station: 0 Exam by:: Erianna Jolly,CNM  Labs: Lab Results  Component Value Date   WBC 6.4 09/11/2012   HGB 12.4 09/11/2012   HCT 36.8 09/11/2012   MCV 83.3 09/11/2012   PLT 239 09/11/2012    Assessment / Plan: Induction of labor due to non-reassuring fetal testing,  progressing well on pitocin  Labor: Progressing normally Preeclampsia:  NA Fetal Wellbeing:  Category I Pain Control:  Epidural I/D:  n/a Anticipated MOD:  NSVD  Tawnya Crook 09/11/2012, 11:19 PM

## 2012-09-12 ENCOUNTER — Encounter (HOSPITAL_COMMUNITY): Payer: Self-pay | Admitting: *Deleted

## 2012-09-12 DIAGNOSIS — O4100X0 Oligohydramnios, unspecified trimester, not applicable or unspecified: Secondary | ICD-10-CM | POA: Diagnosis present

## 2012-09-12 LAB — RPR: RPR Ser Ql: NONREACTIVE

## 2012-09-12 MED ORDER — OXYCODONE-ACETAMINOPHEN 5-325 MG PO TABS: 1.0000 | ORAL_TABLET | ORAL | Status: DC | PRN

## 2012-09-12 MED ORDER — IBUPROFEN 600 MG PO TABS
600.0000 mg | ORAL_TABLET | Freq: Four times a day (QID) | ORAL | Status: DC
  Administered 2012-09-12 – 2012-09-14 (×7): 600 mg via ORAL
  Filled 2012-09-12 (×8): qty 1

## 2012-09-12 MED ORDER — MISOPROSTOL 200 MCG PO TABS
ORAL_TABLET | ORAL | Status: AC
  Filled 2012-09-12: qty 5

## 2012-09-12 MED ORDER — ONDANSETRON HCL 4 MG/2ML IJ SOLN: 4.0000 mg | INTRAMUSCULAR | Status: DC | PRN

## 2012-09-12 MED ORDER — LANOLIN HYDROUS EX OINT: TOPICAL_OINTMENT | CUTANEOUS | Status: DC | PRN

## 2012-09-12 MED ORDER — TETANUS-DIPHTH-ACELL PERTUSSIS 5-2.5-18.5 LF-MCG/0.5 IM SUSP: 0.5000 mL | Freq: Once | INTRAMUSCULAR | Status: DC

## 2012-09-12 MED ORDER — DIPHENHYDRAMINE HCL 25 MG PO CAPS: 25.0000 mg | ORAL_CAPSULE | Freq: Four times a day (QID) | ORAL | Status: DC | PRN

## 2012-09-12 MED ORDER — WITCH HAZEL-GLYCERIN EX PADS: 1.0000 "application " | MEDICATED_PAD | CUTANEOUS | Status: DC | PRN

## 2012-09-12 MED ORDER — BENZOCAINE-MENTHOL 20-0.5 % EX AERO: 1.0000 "application " | INHALATION_SPRAY | CUTANEOUS | Status: DC | PRN

## 2012-09-12 MED ORDER — PRENATAL MULTIVITAMIN CH
1.0000 | ORAL_TABLET | Freq: Every day | ORAL | Status: DC
  Administered 2012-09-12 – 2012-09-13 (×2): 1 via ORAL
  Filled 2012-09-12 (×2): qty 1

## 2012-09-12 MED ORDER — ONDANSETRON HCL 4 MG PO TABS: 4.0000 mg | ORAL_TABLET | ORAL | Status: DC | PRN

## 2012-09-12 MED ORDER — DIBUCAINE 1 % RE OINT: 1.0000 "application " | TOPICAL_OINTMENT | RECTAL | Status: DC | PRN

## 2012-09-12 MED ORDER — SIMETHICONE 80 MG PO CHEW: 80.0000 mg | CHEWABLE_TABLET | ORAL | Status: DC | PRN

## 2012-09-12 MED ORDER — SENNOSIDES-DOCUSATE SODIUM 8.6-50 MG PO TABS
2.0000 | ORAL_TABLET | Freq: Every day | ORAL | Status: DC
  Administered 2012-09-12 – 2012-09-13 (×2): 2 via ORAL

## 2012-09-12 MED ORDER — MISOPROSTOL 200 MCG PO TABS
1000.0000 ug | ORAL_TABLET | Freq: Once | ORAL | Status: AC
  Administered 2012-09-12: 1000 ug via RECTAL

## 2012-09-12 MED ORDER — ZOLPIDEM TARTRATE 5 MG PO TABS: 5.0000 mg | ORAL_TABLET | Freq: Every evening | ORAL | Status: DC | PRN

## 2012-09-12 MED ORDER — MISOPROSTOL 200 MCG PO TABS
ORAL_TABLET | ORAL | Status: AC
  Filled 2012-09-12: qty 1

## 2012-09-12 NOTE — Progress Notes (Signed)
Donna Thomas is a 27 y.o. G2P1001 at [redacted]w[redacted]d by ultrasound admitted for IOL secondary to oligohydramnios.  Subjective: Comfortable with epidural.  Objective: BP 136/78  Pulse 93  Temp(Src) 98.4 F (36.9 C) (Oral)  Resp 20  Ht 5\' 6"  (1.676 m)  Wt 244 lb (110.678 kg)  BMI 39.4 kg/m2  SpO2 100%  LMP 02/12/2012      FHT:  FHR: 145 bpm, variability: minimal ,  accelerations:  Present,  decelerations:  Absent + scalp stim UC:   regular, every 1-2 minutes SVE:   Dilation: 6.5 Effacement (%): 80 Station: -1 Exam by:: Sowder,RNC  Labs: Lab Results  Component Value Date   WBC 6.4 09/11/2012   HGB 12.4 09/11/2012   HCT 36.8 09/11/2012   MCV 83.3 09/11/2012   PLT 239 09/11/2012   AROM-clear fluid, IUPC placed.  Assessment / Plan: Induction of labor due to oligohydramnios,  progressing well on pitocin  Labor: slow progress. Preeclampsia:  no signs or symptoms of toxicity Fetal Wellbeing:  Category II Pain Control:  Epidural I/D:  n/a Anticipated MOD:  NSVD  Mekala Winger S 09/12/2012, 11:02 AM

## 2012-09-13 ENCOUNTER — Encounter: Payer: Medicaid Other | Admitting: Family Medicine

## 2012-09-13 NOTE — Progress Notes (Signed)
UR chart review completed.  

## 2012-09-13 NOTE — Progress Notes (Signed)
Post Partum Day 1 Subjective: no complaints, up ad lib, voiding and tolerating PO  Objective: Blood pressure 121/71, pulse 65, temperature 98 F (36.7 C), temperature source Oral, resp. rate 19, height 5\' 6"  (1.676 m), weight 110.678 kg (244 lb), last menstrual period 02/12/2012, SpO2 99.00%, unknown if currently breastfeeding.  Physical Exam:  General: alert, cooperative and no distress Lochia: appropriate Uterine Fundus: firm Incision: na DVT Evaluation: No evidence of DVT seen on physical exam.   Recent Labs  09/11/12 1130  HGB 12.4  HCT 36.8    Assessment/Plan: Plan for discharge tomorrow and Contraception desires nexplanon. she is bottlefeeding   LOS: 2 days   Wess Baney L 09/13/2012, 10:40 AM

## 2012-09-13 NOTE — Anesthesia Postprocedure Evaluation (Signed)
  Anesthesia Post-op Note  Patient: Donna Thomas  Procedure(s) Performed: * No procedures listed *  Patient Location: PACU and Mother/Baby  Anesthesia Type:Epidural  Level of Consciousness: awake, alert  and oriented  Airway and Oxygen Therapy: Patient Spontanous Breathing  Post-op Pain: none  Post-op Assessment: Post-op Vital signs reviewed, Patient's Cardiovascular Status Stable, No headache, No backache, No residual numbness and No residual motor weakness  Post-op Vital Signs: Reviewed and stable  Complications: No apparent anesthesia complications

## 2012-09-14 ENCOUNTER — Encounter: Payer: Self-pay | Admitting: Family

## 2012-09-14 MED ORDER — IBUPROFEN 600 MG PO TABS: 600.0000 mg | ORAL_TABLET | Freq: Four times a day (QID) | ORAL | Status: DC | PRN

## 2012-09-14 NOTE — Discharge Summary (Signed)
Obstetric Discharge Summary Reason for Admission: induction of labor for oligohydramnios Prenatal Procedures: ultrasound Intrapartum Procedures: spontaneous vaginal delivery Postpartum Procedures: none Complications-Operative and Postpartum: none Hemoglobin  Date Value Range Status  09/11/2012 12.4  12.0 - 15.0 g/dL Final     HCT  Date Value Range Status  09/11/2012 36.8  36.0 - 46.0 % Final   Donna Thomas was seen in [facility] the day of admission and ultrasound showed oligohydramnios with AFI of 5.63cm (<3%tile) and EFW of 2783g (43rd%tile). She was induced with pitocin and progressed well. She had NSVD of viable female. She received Cytotec PR for excessive bleeding with an EBL of . No postpartum complications. Today, she and baby are doing fine. She is up walking around with no complaints. She is bottle feeding only and wants Nexplanon for contraception.  Physical Exam:  General: alert, cooperative and no distress Lochia: appropriate Uterine Fundus: firm Incision: n/a DVT Evaluation: No evidence of DVT seen on physical exam. Negative Homan's sign. No cords or calf tenderness. No significant calf/ankle edema.  Discharge Diagnoses: Term Pregnancy-delivered  Discharge Information: Date: 09/14/2012 Activity: pelvic rest Diet: routine Medications: PNV and Ibuprofen Condition: stable Instructions: refer to practice specific booklet Discharge to: home Follow-up Information   Follow up with [facility]. Schedule an appointment as soon as possible for a visit in 5 weeks. (Postpartum follow-up)       Newborn Data: Live born female  Birth Weight: 5 lb 8.9 oz (2520 g) APGAR: 9, 9  Home with mother.  Jacquelin Hawking, MD 09/14/2012, 10:12 AM  I have seen and examined this patient and agree with above documentation in the resident's note.   Rulon Abide, M.D. [facility] Fellow 09/14/2012 11:22 AM

## 2012-09-15 NOTE — H&P (Signed)
Attestation of Attending Supervision of Advanced Practitioner (CNM/NP): Evaluation and management procedures were performed by the Advanced Practitioner under my supervision and collaboration.  I have reviewed the Advanced Practitioner's note and chart, and I agree with the management and plan.  Tymika Grilli 09/15/2012 1:28 PM

## 2012-10-12 ENCOUNTER — Encounter (HOSPITAL_COMMUNITY): Payer: Self-pay | Admitting: Emergency Medicine

## 2012-10-12 ENCOUNTER — Ambulatory Visit (INDEPENDENT_AMBULATORY_CARE_PROVIDER_SITE_OTHER): Payer: Medicaid Other | Admitting: Family

## 2012-10-12 ENCOUNTER — Emergency Department (HOSPITAL_COMMUNITY)
Admission: EM | Admit: 2012-10-12 | Discharge: 2012-10-12 | Disposition: A | Discharge status: Home / Self Care | Payer: Medicaid Other | Source: Home / Self Care

## 2012-10-12 ENCOUNTER — Encounter: Payer: Self-pay | Admitting: Family

## 2012-10-12 DIAGNOSIS — H1030 Unspecified acute conjunctivitis, unspecified eye: Secondary | ICD-10-CM

## 2012-10-12 DIAGNOSIS — H1031 Unspecified acute conjunctivitis, right eye: Secondary | ICD-10-CM

## 2012-10-12 MED ORDER — POLYMYXIN B-TRIMETHOPRIM 10000-0.1 UNIT/ML-% OP SOLN: 1.0000 [drp] | OPHTHALMIC | Status: DC

## 2012-10-12 NOTE — Progress Notes (Signed)
Patient ID: Donna Thomas, female   DOB: 1985-11-24, 27 y.o.   MRN: [id number] I examined pt and agree with documentation above and nurse midwife student plan of care. [facility]

## 2012-10-12 NOTE — ED Notes (Signed)
C/o irritation, burning, and tearing of right eye. Onset today. Denies fever and any other symptoms. Pt has not tried any otc meds for treatment

## 2012-10-12 NOTE — ED Provider Notes (Signed)
CSN: [id number]     Arrival date & time 10/12/12  1814 History   First MD Initiated Contact with Patient 10/12/12 1918     Chief Complaint  Patient presents with  . Conjunctivitis    irritation,burning, and tearing of right eye.    (Consider location/radiation/quality/duration/timing/severity/associated sxs/prior Treatment) HPI Comments: 27 year old female awoke this morning with some burning and stinging in the right eye. She states it feels like there is some roughness in the eye. It has been draining a prelude exudate. Denies problems with vision or photophobia. Denies actual eye pain.   Past Medical History  Diagnosis Date  . Infection   . Headache(784.0)    History reviewed. No pertinent past surgical history. Family History  Problem Relation Age of Onset  . Diabetes Maternal Grandmother   . Cancer Maternal Grandmother    History  Substance Use Topics  . Smoking status: Never Smoker   . Smokeless tobacco: Never Used  . Alcohol Use: No   OB History   Grav Para Term Preterm Abortions TAB SAB Ect Mult Living   2 2 2       2      Review of Systems  Constitutional: Negative.   HENT: Negative.   Eyes: Positive for discharge, redness and itching. Negative for photophobia and visual disturbance.  Respiratory: Negative.     Allergies  Review of patient's allergies indicates no known allergies.  Home Medications   Current Outpatient Rx  Name  Route  Sig  Dispense  Refill  . Prenatal Vit-Fe Fumarate-FA (PRENATAL MULTIVITAMIN) TABS   Oral   Take 1 tablet by mouth daily at 12 noon.         Marland Kitchen ibuprofen (ADVIL,MOTRIN) 600 MG tablet   Oral   Take 1 tablet (600 mg total) by mouth every 6 (six) hours as needed for pain.   30 tablet   0   . trimethoprim-polymyxin b (POLYTRIM) ophthalmic solution   Right Eye   Place 1 drop into the right eye every 4 (four) hours.   10 mL   0    BP 143/79  Pulse 78  Temp(Src) 98.2 F (36.8 C) (Oral)  Resp 16  SpO2 100%   Breastfeeding? No Physical Exam  Nursing note and vitals reviewed. Constitutional: She is oriented to person, place, and time. She appears well-developed and well-nourished. No distress.  HENT:  Head: Normocephalic and atraumatic.  Eyes: EOM are normal. Pupils are equal, round, and reactive to light. Right eye exhibits discharge. Left eye exhibits no discharge.  Sclera injected. Conjunctivae are red and lower conjunctiva red and erythematous. Under magnification no apparent foreign body or corneal abrasion.  Neck: Normal range of motion. Neck supple.  Cardiovascular: Normal rate.   Pulmonary/Chest: Effort normal.  Neurological: She is alert and oriented to person, place, and time. She exhibits normal muscle tone.  Skin: Skin is warm and dry.  Psychiatric: She has a normal mood and affect.    ED Course  Procedures (including critical care time) Labs Review Labs Reviewed - No data to display Imaging Review No results found.  MDM   1. Conjunctivitis, acute, right eye     Polytrim 1 drop in the right before hours. Particularly use warm moist compresses for cleansing, comfort and healing. If not improving in 2 days return or followup your PCP. Make sure your washing her hands frequently.  Hayden Rasmussen, NP 10/12/12 [phone]

## 2012-10-12 NOTE — ED Provider Notes (Signed)
Medical screening examination/treatment/procedure(s) were performed by non-physician practitioner and as supervising physician I was immediately available for consultation/collaboration.  Keshanna Riso, M.D.  Estiven Kohan C Bladyn Tipps, MD 10/12/12 2137 

## 2012-10-12 NOTE — Progress Notes (Signed)
  Subjective:     Donna Thomas is a 27 y.o. female who presents for a postpartum visit. She is 4 weeks postpartum following a spontaneous vaginal delivery. I have fully reviewed the prenatal and intrapartum course. The delivery was at 38 gestational weeks. Outcome: spontaneous vaginal delivery. Anesthesia: epidural. Postpartum course has been uncomplicated. Baby's course has been uncomplicated. Baby is feeding by bottle - Enfamil AR. Bleeding no bleeding. Bowel function is normal. Bladder function is normal. Patient is not sexually active. Contraception method is none. Postpartum depression screening: negative.  The following portions of the patient's history were reviewed and updated as appropriate: allergies, current medications, past family history, past medical history, past social history, past surgical history and problem list.  Review of Systems Pertinent items are noted in HPI.   Objective:    BP 130/89  Pulse 81  Temp(Src) 98.6 F (37 C)  Ht 5\' 6"  (1.676 m)  Wt 240 lb 12.8 oz (109.226 kg)  BMI 38.88 kg/m2  Breastfeeding? No  General:  alert, cooperative and no distress   Breasts:  inspection negative, no nipple discharge or bleeding, no masses or nodularity palpable  Lungs: clear to auscultation bilaterally  Heart:  regular rate and rhythm, S1, S2 normal, no murmur, click, rub or gallop  Abdomen: soft, non-tender; bowel sounds normal; no masses,  no organomegaly   Vulva:  not evaluated  Vagina: not evaluated  Cervix:  Not evaluated  Corpus: not examined  Adnexa:  not evaluated  Rectal Exam: Not performed.        Assessment:     Normal postpartum exam. Pap smear not done at today's visit.   Plan:    1. Contraception: none. Desires nexplanon. 2. Education about abnormal pap smear results.  Encouraged patient to return for next scheduled pap in May 2015.  3. Follow up in: 1-2 weeks for Nexplanon insertion or as needed.

## 2012-10-12 NOTE — Progress Notes (Signed)
Here for postpartum visit. C./o right eye sore, burning with pinkness noted.

## 2012-10-18 ENCOUNTER — Ambulatory Visit: Payer: Medicaid Other | Admitting: Obstetrics & Gynecology

## 2012-11-22 ENCOUNTER — Other Ambulatory Visit: Payer: Self-pay

## 2013-11-14 ENCOUNTER — Encounter (HOSPITAL_COMMUNITY): Payer: Self-pay | Admitting: Emergency Medicine

## 2013-11-14 ENCOUNTER — Emergency Department (HOSPITAL_COMMUNITY)
Admission: EM | Admit: 2013-11-14 | Discharge: 2013-11-14 | Disposition: A | Discharge status: Home / Self Care | Payer: Medicaid Other | Attending: Emergency Medicine | Admitting: Emergency Medicine

## 2013-11-14 DIAGNOSIS — S169XXA Unspecified injury of muscle, fascia and tendon at neck level, initial encounter: Secondary | ICD-10-CM | POA: Insufficient documentation

## 2013-11-14 DIAGNOSIS — Y9389 Activity, other specified: Secondary | ICD-10-CM | POA: Diagnosis not present

## 2013-11-14 DIAGNOSIS — Z8619 Personal history of other infectious and parasitic diseases: Secondary | ICD-10-CM | POA: Insufficient documentation

## 2013-11-14 DIAGNOSIS — Y92410 Unspecified street and highway as the place of occurrence of the external cause: Secondary | ICD-10-CM | POA: Diagnosis not present

## 2013-11-14 DIAGNOSIS — S161XXA Strain of muscle, fascia and tendon at neck level, initial encounter: Secondary | ICD-10-CM

## 2013-11-14 DIAGNOSIS — S3992XA Unspecified injury of lower back, initial encounter: Secondary | ICD-10-CM | POA: Insufficient documentation

## 2013-11-14 DIAGNOSIS — S199XXA Unspecified injury of neck, initial encounter: Secondary | ICD-10-CM | POA: Diagnosis present

## 2013-11-14 MED ORDER — NAPROXEN 500 MG PO TBEC: 500.0000 mg | DELAYED_RELEASE_TABLET | Freq: Two times a day (BID) | ORAL | Status: DC

## 2013-11-14 MED ORDER — CYCLOBENZAPRINE HCL 10 MG PO TABS: 10.0000 mg | ORAL_TABLET | Freq: Two times a day (BID) | ORAL | Status: DC | PRN
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
## 2013-11-14 NOTE — ED Provider Notes (Signed)
Medical screening examination/treatment/procedure(s) were performed by non-physician practitioner and as supervising physician I was immediately available for consultation/collaboration.  Decklyn Hyder L Adnan Vanvoorhis, MD 11/14/13 1719 

## 2013-11-18 ENCOUNTER — Encounter (HOSPITAL_COMMUNITY): Payer: Self-pay | Admitting: Emergency Medicine

## 2014-03-03 IMAGING — US US OB LIMITED
1 series · 13 of 28 positions shown · non-contrast
Comparison: none

[Series 1: us ob comp less 14 wks · 13 of 31 slices shown]
[im 2/31]
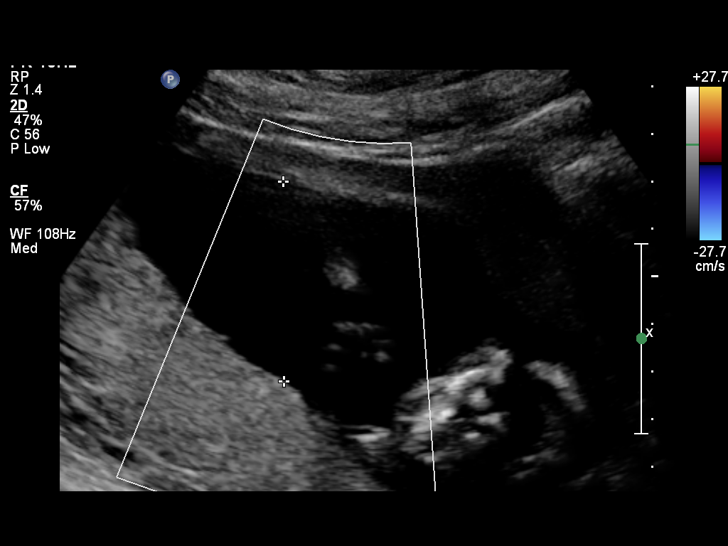
[im 4/31]
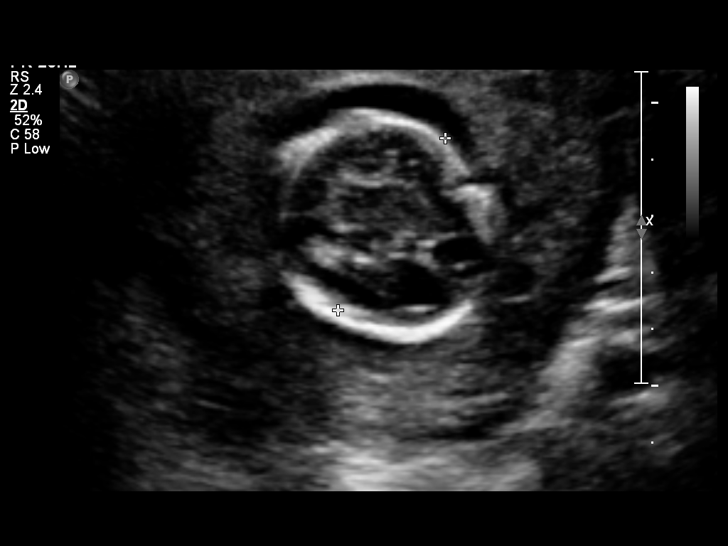
[im 6/31]
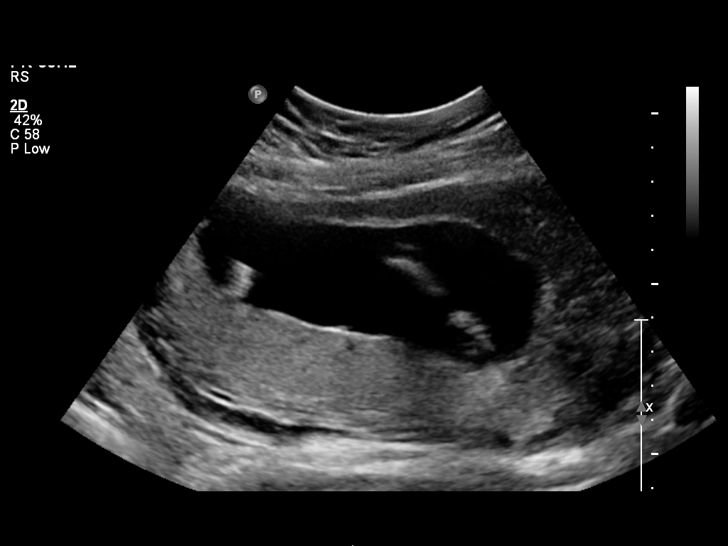
[im 8/31]
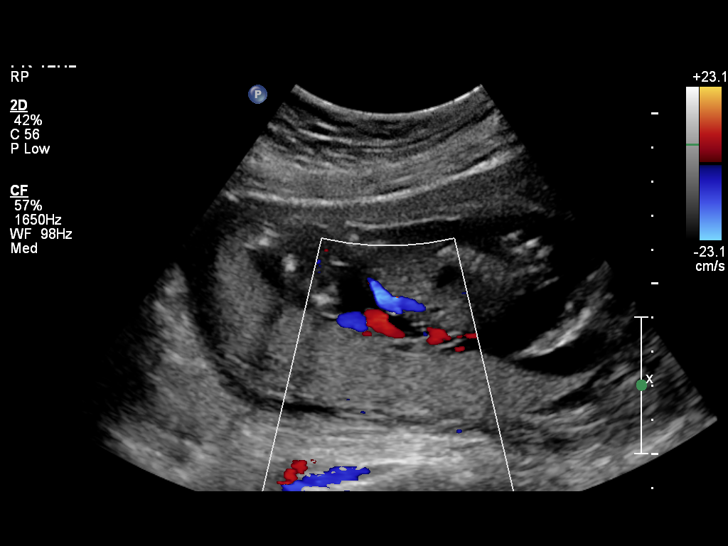
[im 11/31]
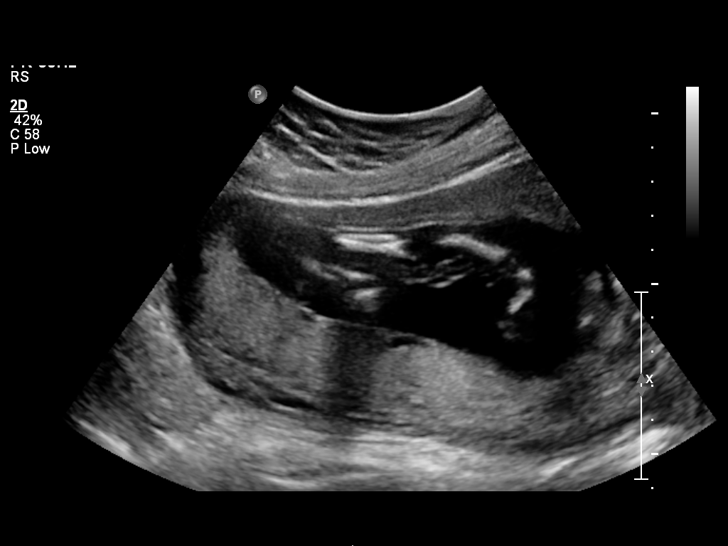
[im 13/31]
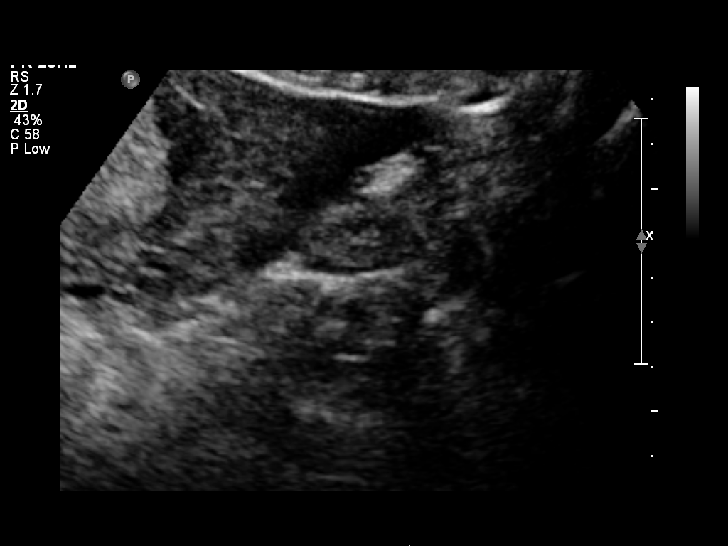
[im 16/31]
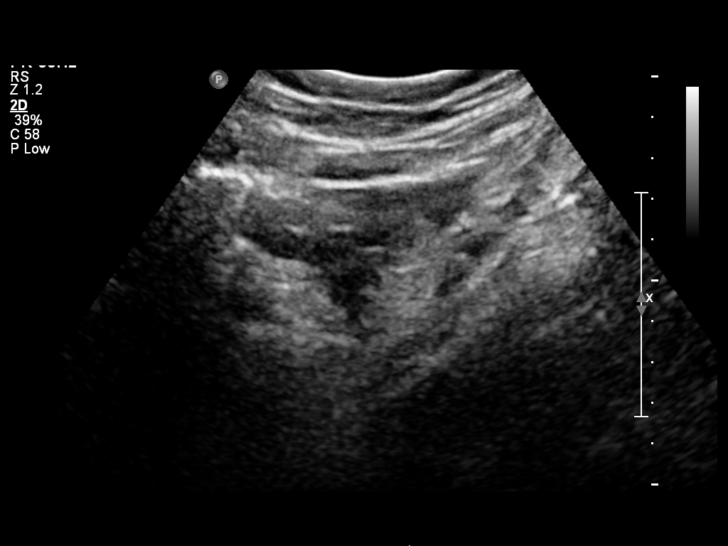
[im 18/31]
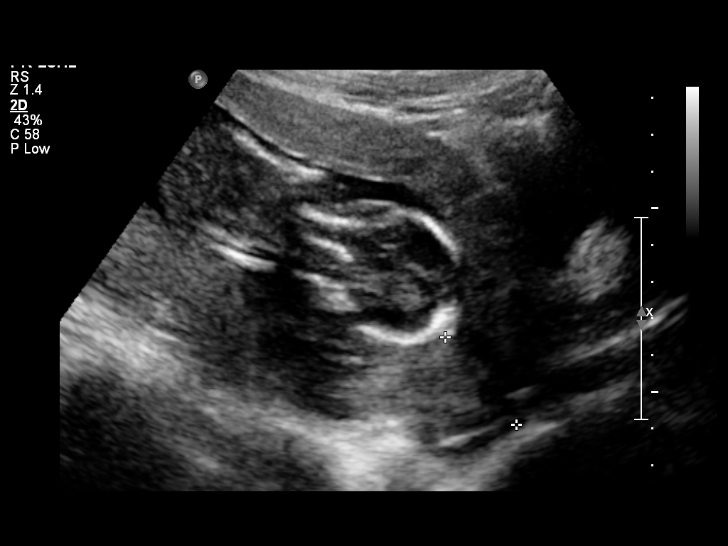
[im 21/31]
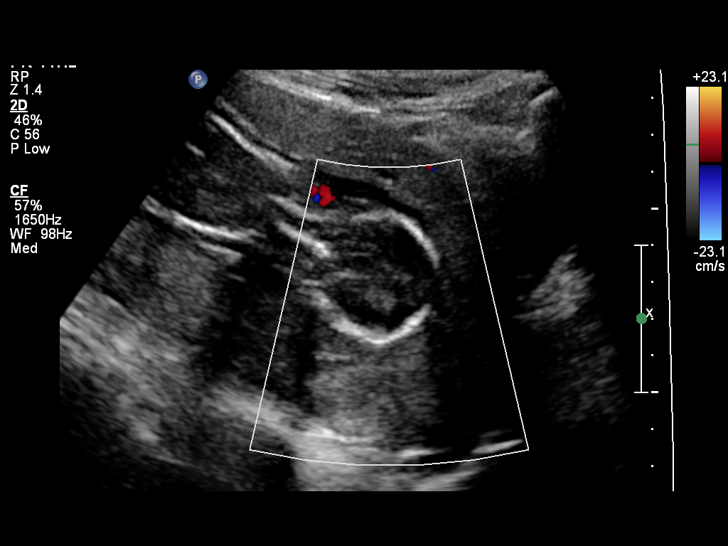
[im 23/31]
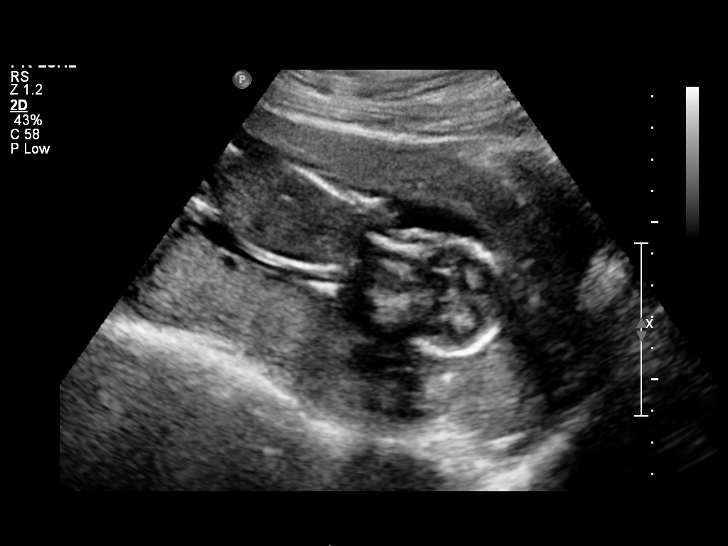
[im 25/31]
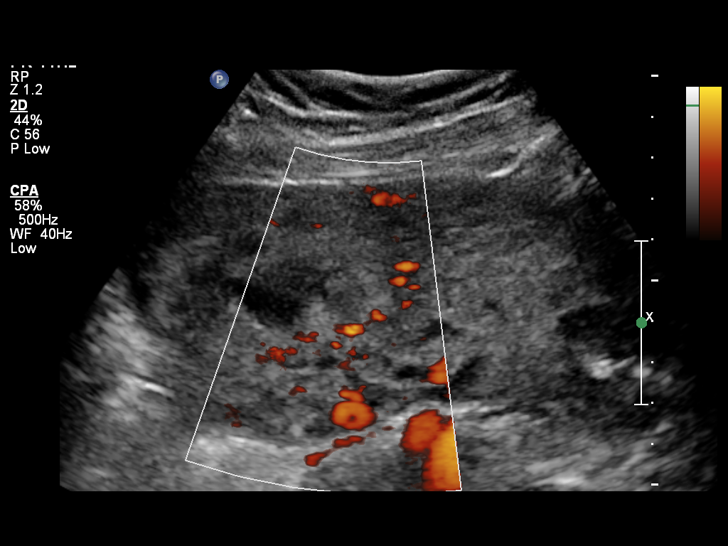
[im 27/31]
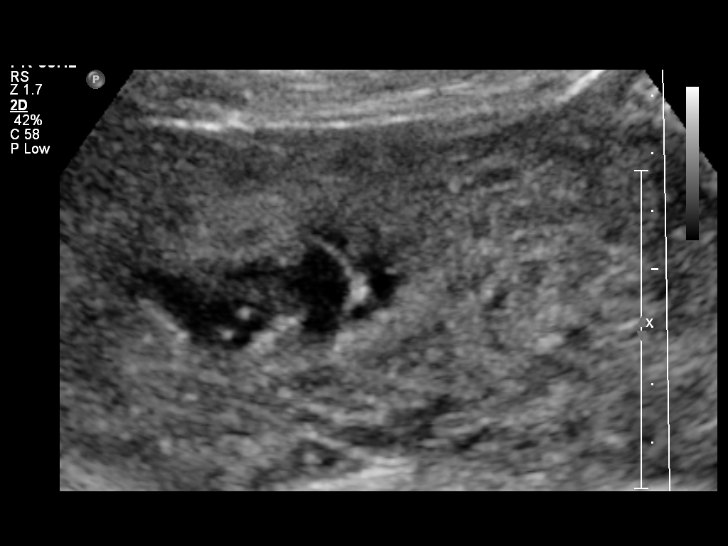
[im 29/31]
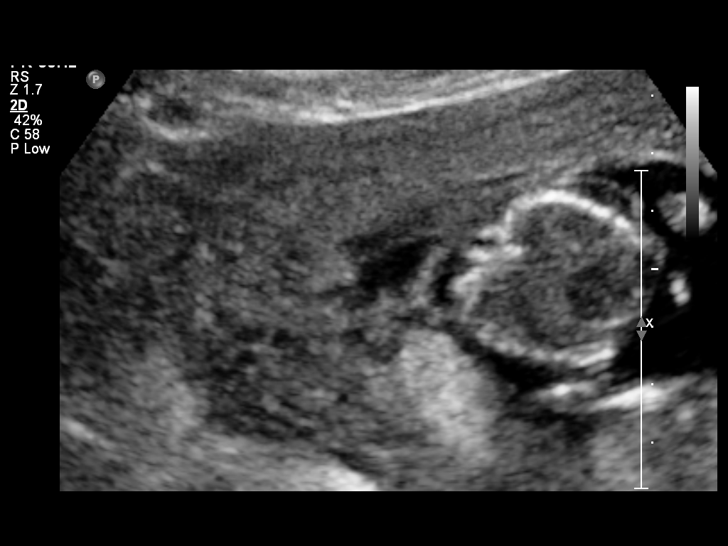

[13 of 28 positions shown; findings below may reference images not displayed]

OBSTETRICS REPORT
                      (Signed Final 04/17/2012 [DATE])

Service(s) Provided

 [HOSPITAL]                                         76815.0
Indications

 Unsure of LMP;  Establish Gestational [AGE]
 Vaginal bleeding, unknown etiology
 Pain - Abdominal/Pelvic
Fetal Evaluation

 Num Of Fetuses:    1
 Fetal Heart Rate:  147                         bpm
 Cardiac Activity:  Observed
 Presentation:      Cephalic
 Placenta:          Posterior
 P. Cord            Visualized, central
 Insertion:

 Amniotic Fluid
 AFI FV:      Subjectively within normal limits
                                             Larg Pckt:     4.2  cm
Biometry

 BPD:     35.2  mm    G. Age:   16w 6d
Gestational Age

 LMP:           9w 2d        Date:   02/12/12                 EDD:   11/18/12
Cervix Uterus Adnexa

 Cervical Length:   3.06      cm

 Cervix:       Normal appearance by transabdominal scan.

 Left Ovary:   Within normal limits.
 Right Ovary:  Within normal limits.
 Adnexa:     No abnormality visualized.
Impression

 Single living IUP at approximately 93wks GA.
 Normal amniotic fluid volume. Normal cervical length.
 No placental abruption or previa identifed.
Recommendations

 Subsequent complete OB US to establish accurate
 dating/EDC, as well as anatomic evaluation.

## 2014-04-16 IMAGING — US US OB DETAIL+14 WK
2 series · 12 of 28 positions shown · non-contrast
Comparison: none

[Series 1: us ob detail +14 wk · 68 acquisitions, 10 frames shown (1 of 2)]
[im 4/68]
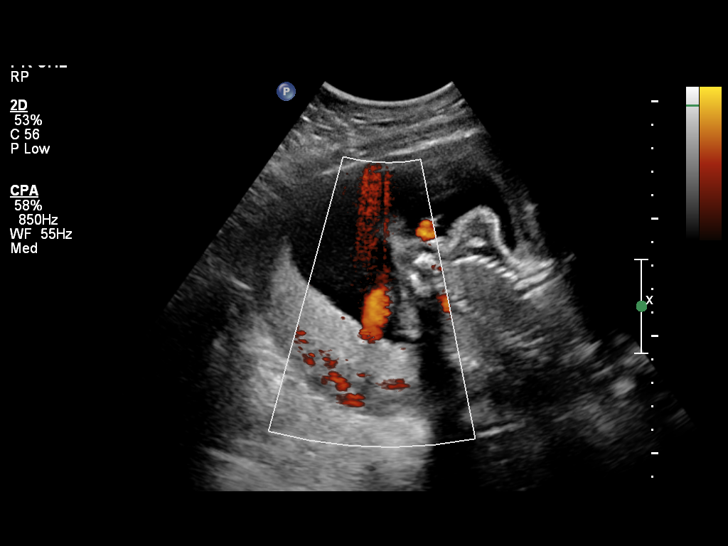
[im 10/68]
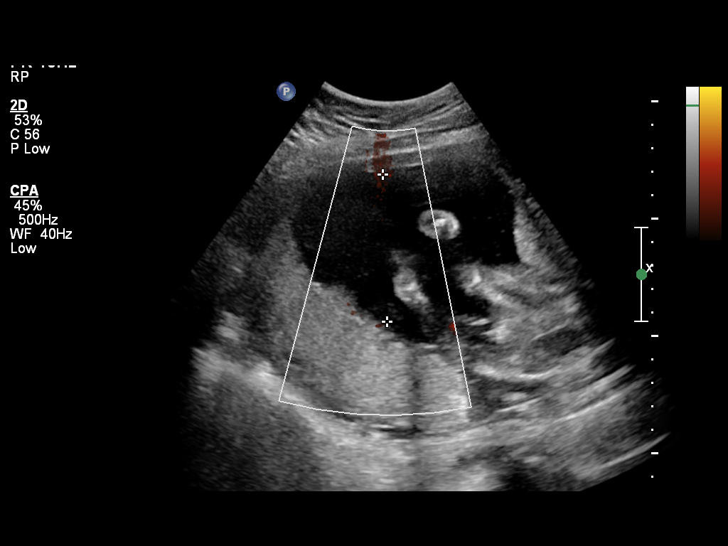
[im 16/68]
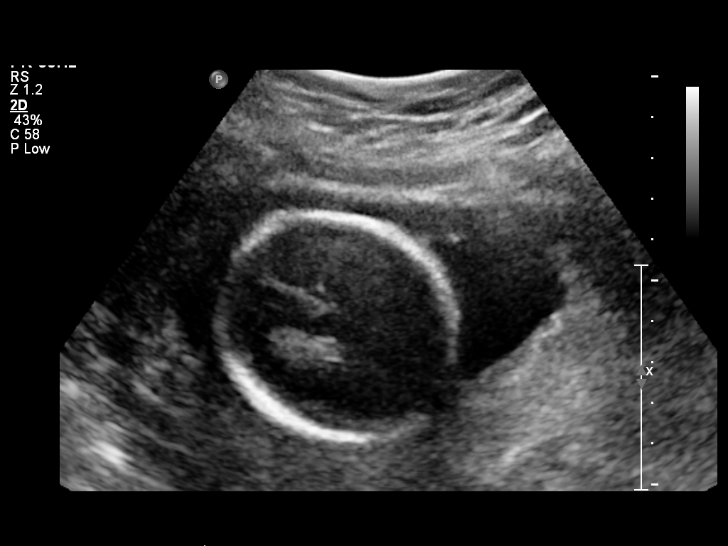
[im 25/68]
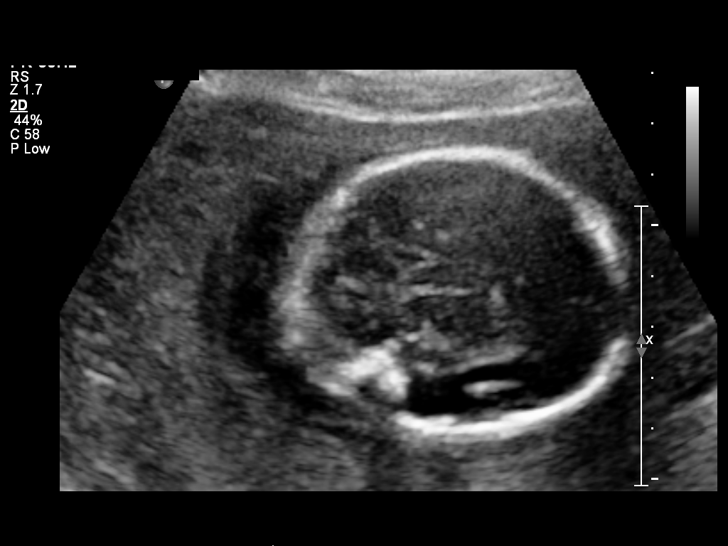
[im 31/68]
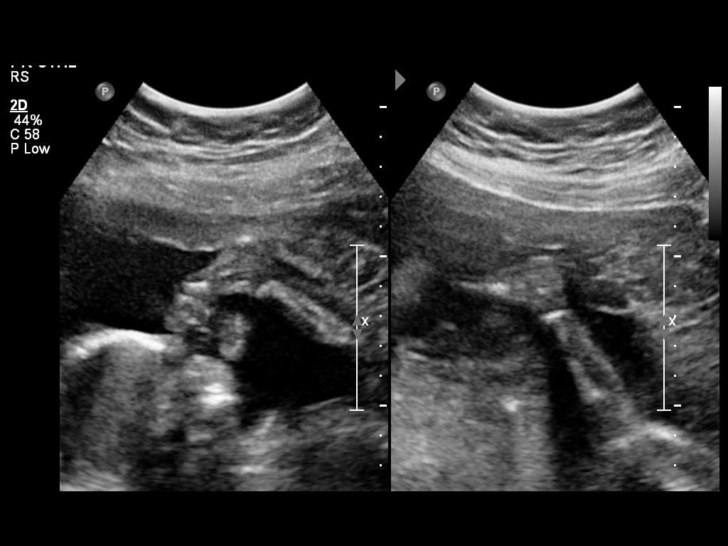
[im 37/68]
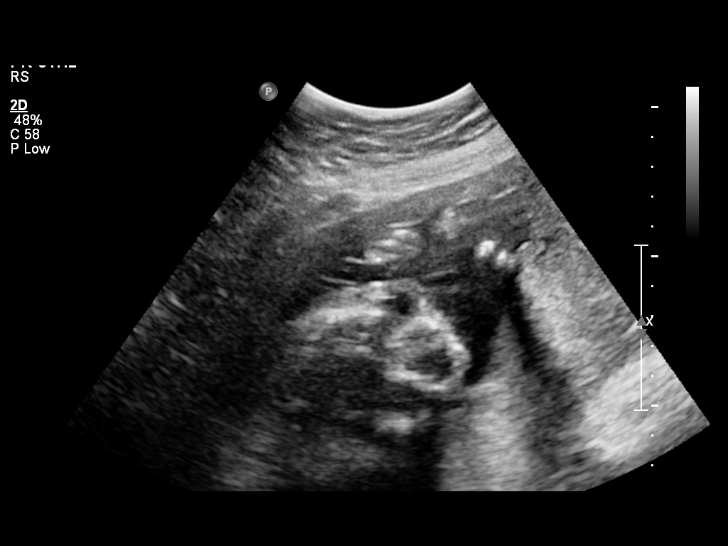
[im 46/68]
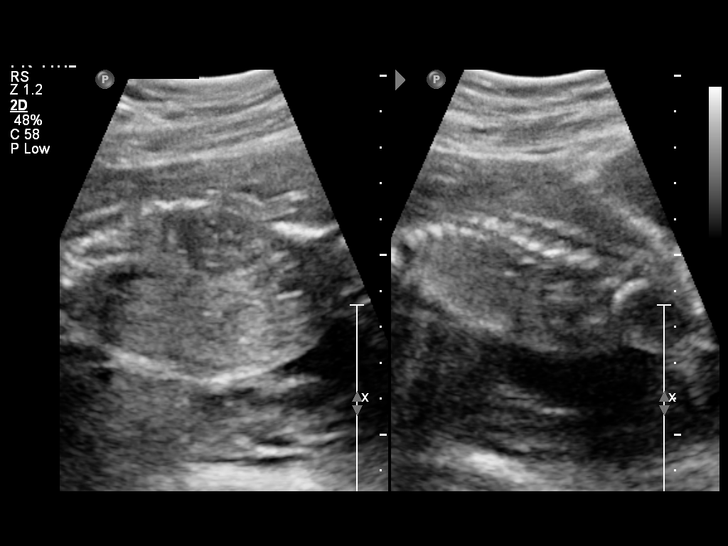
[im 52/68]
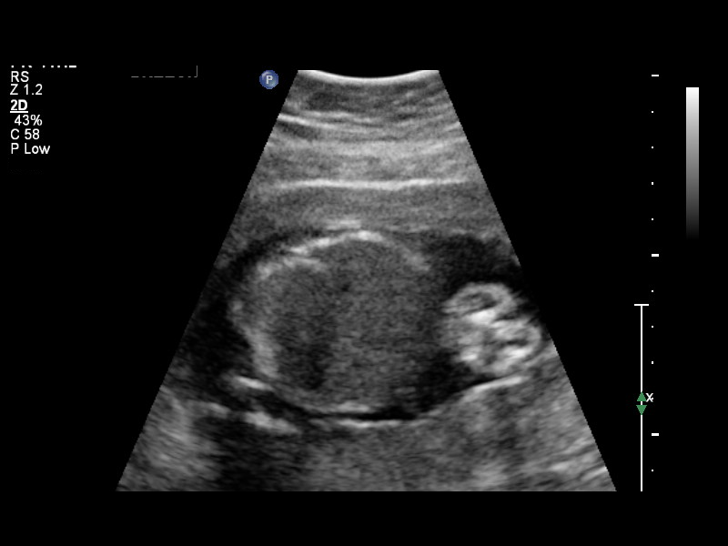
[im 58/68]
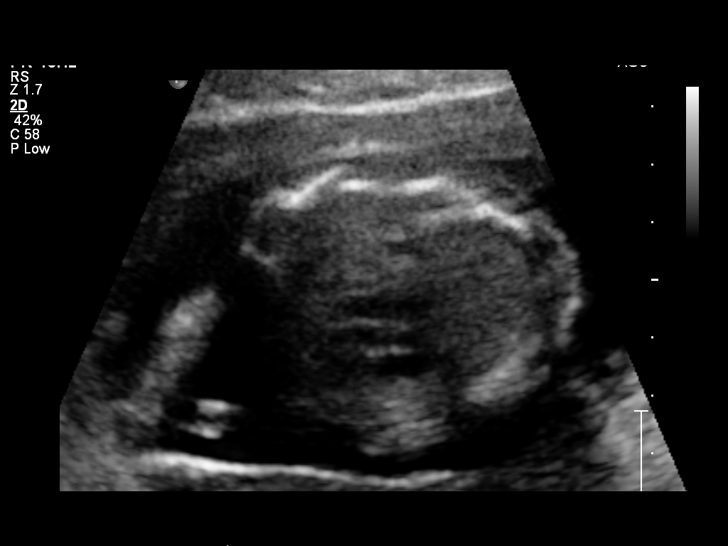
[im 68/68]
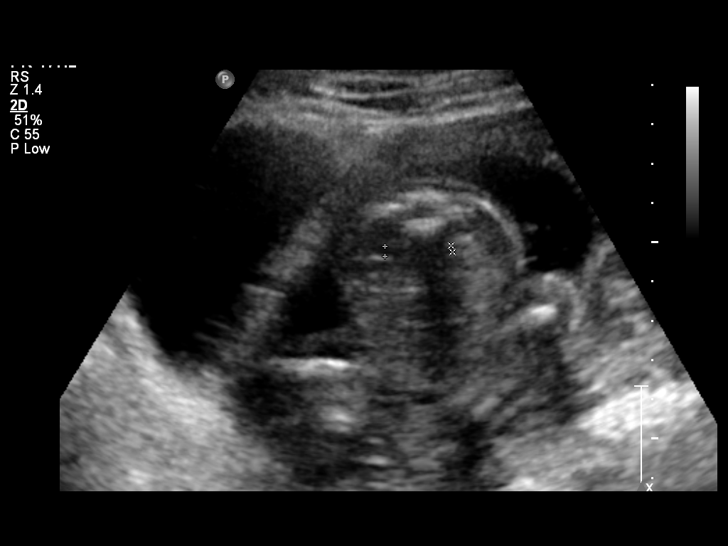

[Series 1: us ob detail +14 wk · 2 of 16 slices shown (2 of 2)]
[im 4/16]
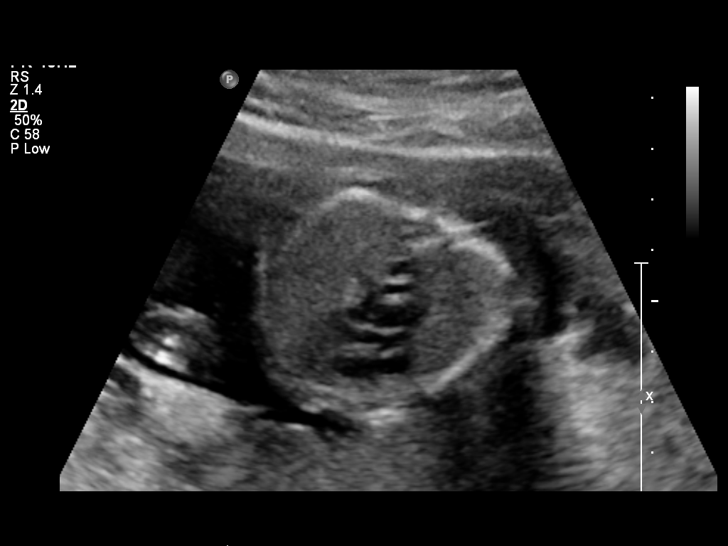
[im 12/16]
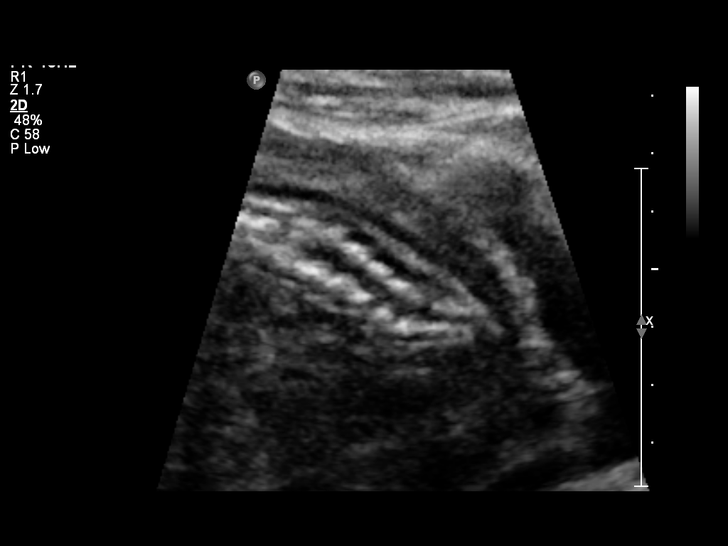

[12 of 28 positions shown; findings below may reference images not displayed]

OBSTETRICS REPORT
                      (Signed Final 05/31/2012 [DATE])

Service(s) Provided

 US OB DETAIL + 14 WK                                  76811.0
Indications

 Detailed fetal anatomic survey
 Unsure of LMP;  Establish Gestational [AGE]
Fetal Evaluation

 Num Of Fetuses:    1
 Fetal Heart Rate:  146                         bpm
 Cardiac Activity:  Observed
 Presentation:      Breech
 Placenta:          Posterior, above cervical
                    os
 P. Cord            Visualized, central
 Insertion:

 Amniotic Fluid
 AFI FV:      Subjectively within normal limits
                                             Larg Pckt:   6.26   cm
Biometry

 BPD:     54.4  mm    G. Age:   22w 4d                CI:        75.25   70 - 86
                                                      FL/HC:      19.0   18.4 -

 HC:     198.9  mm    G. Age:   22w 1d       29  %    HC/AC:      1.13   1.06 -

 AC:     176.1  mm    G. Age:   22w 4d       50  %    FL/BPD:     69.5   71 - 87
 FL:      37.8  mm    G. Age:   22w 1d       33  %    FL/AC:      21.5   20 - 24
 HUM:     35.6  mm    G. Age:   22w 3d       47  %
 CER:     22.7  mm    G. Age:   21w 2d       26  %

 Est. FW:     493  gm      1 lb 1 oz     49  %
Gestational Age

 LMP:           15w 4d       Date:   02/12/12                 EDD:   11/18/12
 U/S Today:     22w 2d                                        EDD:   10/02/12
 Best:          22w 2d    Det. By:   U/S (05/31/12)           EDD:   10/02/12
2nd Trimester Genetic Sonogram - Trisomy 21 Screening
 Age:                                             27          Risk=1:   719

 Structural anomalies (inc. cardiac):             N/A
 Echogenic bowel:                                 No
 Hypoplastic / absent midphalanx 5th Digit:       No
 Wide space 6st-0nd toes:                         N/A
 Pyelectasis:                                     No
 2-vessel umbilical cord:                         No
 Echogenic cardiac foci:                          No

 Incomplete anatomic evaluation
Anatomy

 Cranium:          Appears normal         Aortic Arch:      Appears normal
 Fetal Cavum:      Appears normal         Ductal Arch:      Not well visualized
 Ventricles:       Appears normal         Diaphragm:        Appears normal
 Choroid Plexus:   Appears normal         Stomach:          Appears normal
 Cerebellum:       Appears normal         Abdomen:          Appears normal
 Posterior Fossa:  Appears normal         Abdominal Wall:   Appears nml (cord
                                                            insert, abd wall)
 Nuchal Fold:      Not applicable (>20    Cord Vessels:     Appears normal (3
                   wks GA)                                  vessel cord)
 Face:             Orbits appear          Kidneys:          Appear normal
                   normal
 Lips:             Appears normal         Bladder:          Appears normal
 Heart:            Not well visualized    Spine:            Appears normal
 RVOT:             Not well visualized    Lower             Appears normal
                                          Extremities:
 LVOT:             Appears normal         Upper             Appears normal
                                          Extremities:

 Other:  Gender not well visualized. Heels and 5th digit visualized. Technically
         difficult due to maternal habitus and fetal position.
Targeted Anatomy

 Fetal Central Nervous System
 Cisterna Magna:
Cervix Uterus Adnexa

 Cervical Length:   3.1       cm

 Cervix:       Normal appearance by transabdominal scan.
 Uterus:       No abnormality visualized.
 Cul De Sac:   No free fluid seen.

 Left Ovary:   Not visualized.
 Right Ovary:  Not visualized.
 Adnexa:     No abnormality visualized.
Impression

 Siup demonstrating an EGA by ultrasound of 22w 2d. This is
 6w 4d ahead of expected EGA by LMP of 15w 4d and
 correlates with best dating by today's exam.

 Overall resolution is diminished due to patient habitus. The
 fetal heart is poorly evaluated today. No other focal fetal
 abnormalities are noted. No soft markers for Down Syndrome
 are seen. Follow up evaluation can be performed to reassess
 the fetal heart if desired.  Correlation with other aneuploidy
 screening results, if available, would be recommended for a
 more complete risk assessment.

 Subjectively and quantitatively normal amniotic fluid volume.
 Normal cervical length.

## 2014-07-16 ENCOUNTER — Encounter: Payer: Self-pay | Admitting: Obstetrics & Gynecology

## 2014-07-16 ENCOUNTER — Encounter: Payer: Medicaid Other | Admitting: Family Medicine

## 2014-07-16 DIAGNOSIS — Z01419 Encounter for gynecological examination (general) (routine) without abnormal findings: Secondary | ICD-10-CM

## 2014-07-16 LAB — POCT PREGNANCY, URINE: PREG TEST UR: POSITIVE — AB

## 2014-07-28 IMAGING — US US OB FOLLOW-UP
1 series · 12 of 28 positions shown · non-contrast
Comparison: none

[Series 1: us ob follow up · 12 of 36 slices shown]
[im 2/36]
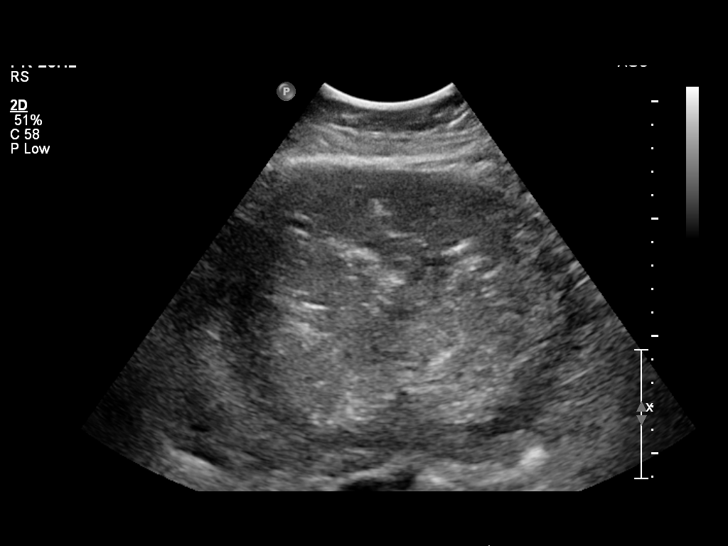
[im 4/36]
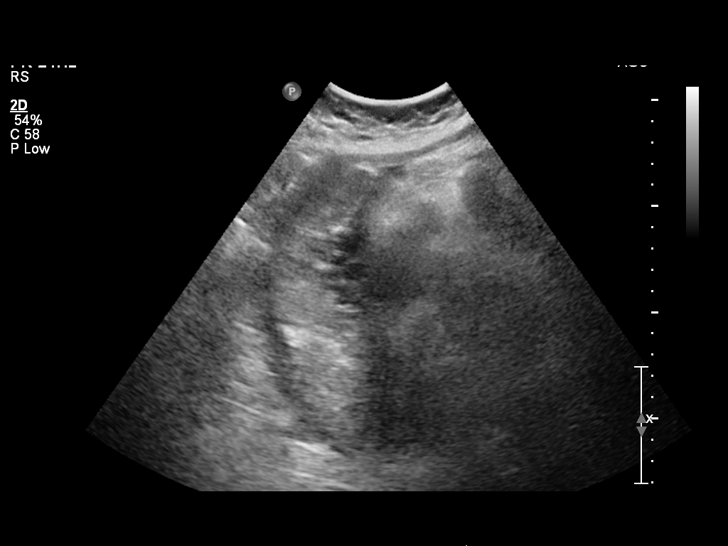
[im 7/36]
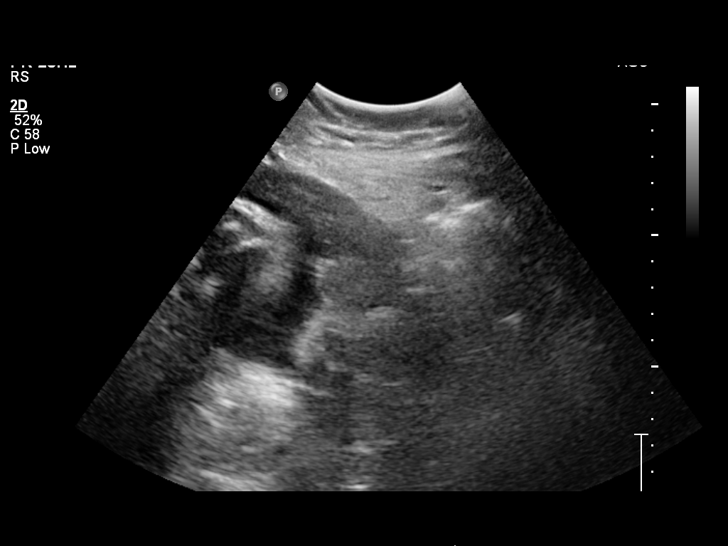
[im 11/36]
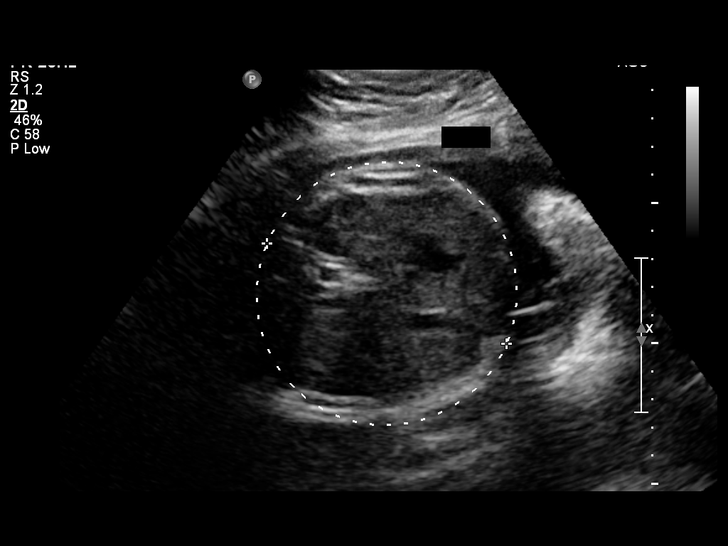
[im 13/36]
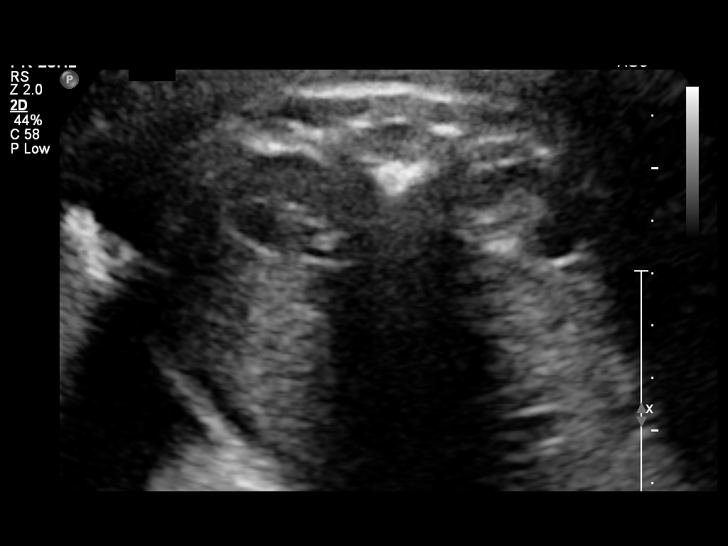
[im 16/36]
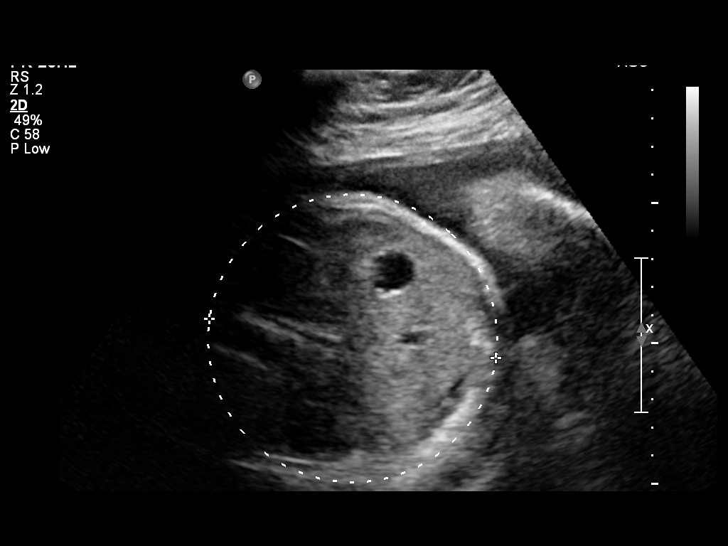
[im 20/36]
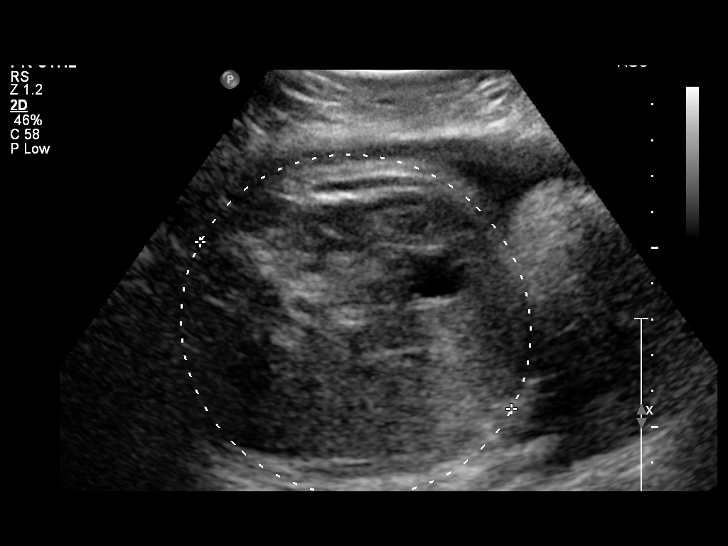
[im 23/36]
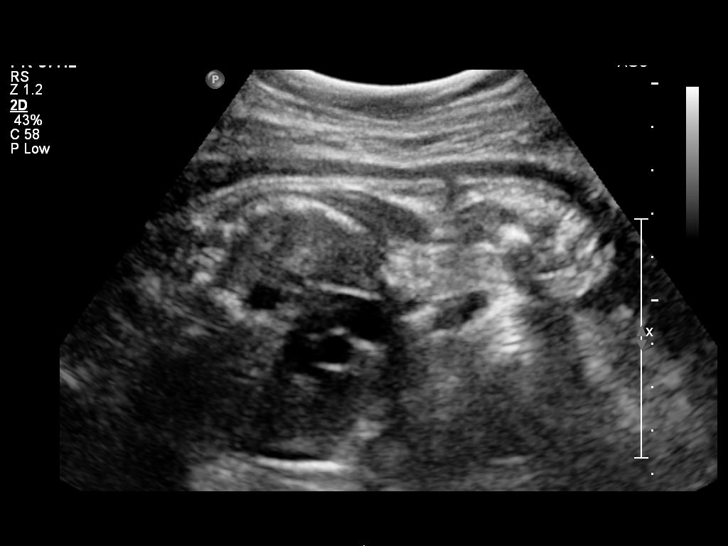
[im 25/36]
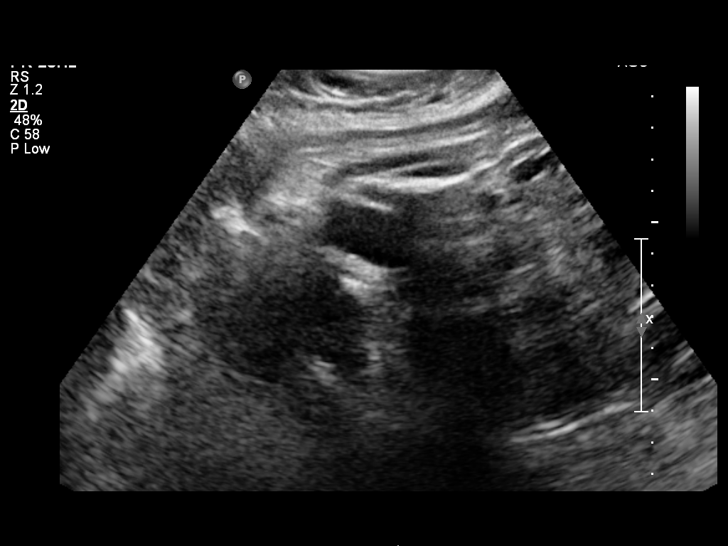
[im 29/36]
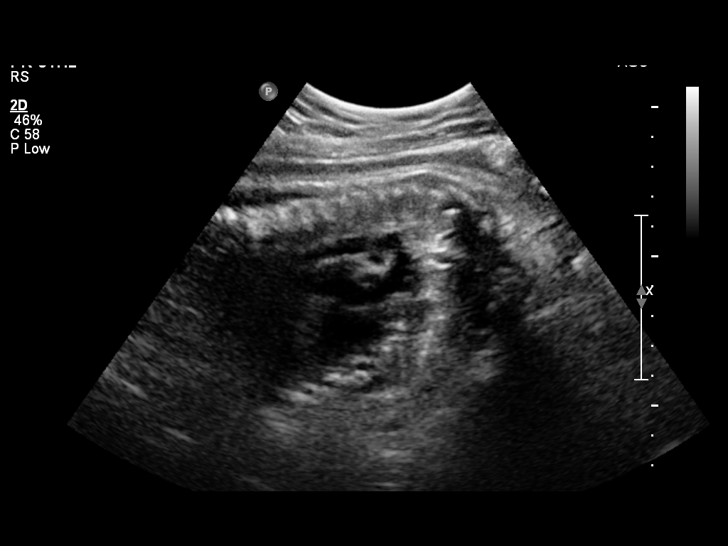
[im 32/36]
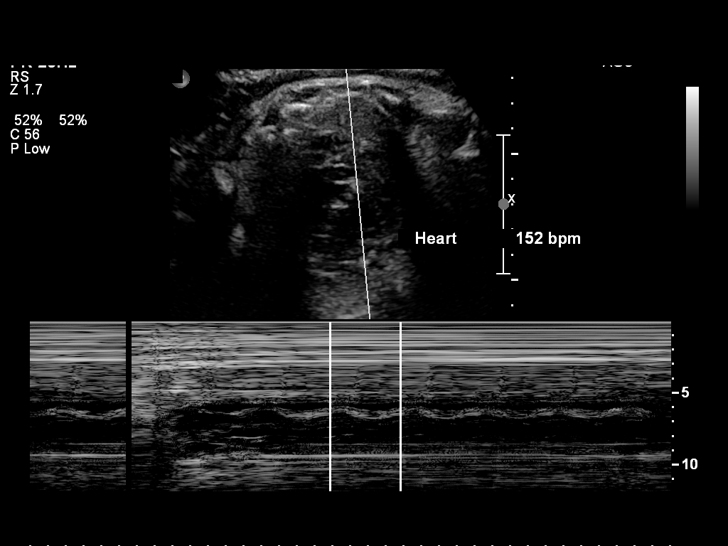
[im 34/36]
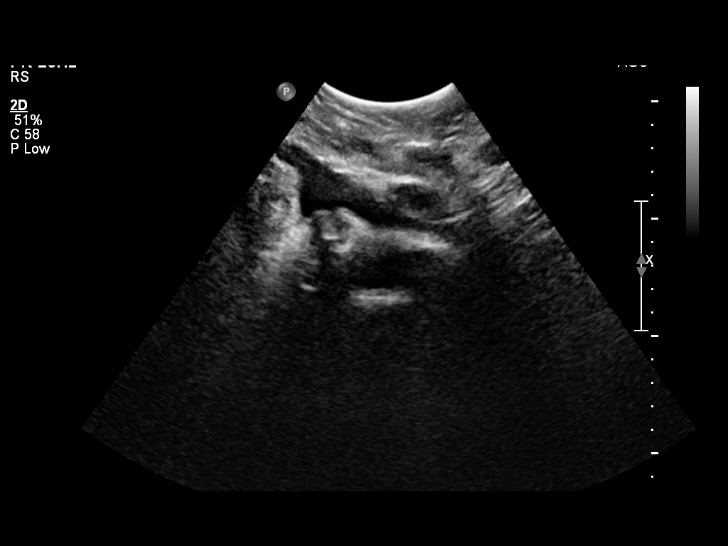

[12 of 28 positions shown; findings below may reference images not displayed]

OBSTETRICS REPORT
                      (Signed Final 09/11/2012 [DATE])

Service(s) Provided

 US OB FOLLOW UP                                       76816.1
Indications

 Follow-up incomplete fetal anatomic evaluation
 Poor obstetric history: Previous SGA infant)
Fetal Evaluation

 Num Of Fetuses:    1
 Fetal Heart Rate:  158                         bpm
 Cardiac Activity:  Observed
 Presentation:      Cephalic
 Placenta:          Posterior, above cervical
                    os
 P. Cord            Previously Visualized
 Insertion:

 Amniotic Fluid
 AFI FV:      Subjectively within normal limits
 AFI Sum:     5.63    cm      < 3  %Tile     Larg Pckt:   3.19   cm
 RUQ:   2.44   cm    LUQ:    3.19   cm
Biometry

 BPD:     90.7  mm    G. Age:   36w 5d                CI:        79.33   70 - 86
                                                      FL/HC:      22.6   20.8 -

 HC:     321.9  mm    G. Age:   36w 3d       13  %    HC/AC:      1.04   0.92 -

 AC:     308.7  mm    G. Age:   34w 5d       10  %    FL/BPD:     80.3   71 - 87
 FL:      72.8  mm    G. Age:   37w 2d       56  %    FL/AC:      23.6   20 - 24
 HUM:     59.3  mm    G. Age:   34w 2d       20  %

 Est. FW:    9453  gm      6 lb 2 oz     43  %
Gestational Age

 LMP:           30w 2d       Date:   02/12/12                 EDD:   11/18/12
 U/S Today:     36w 2d                                        EDD:   10/07/12
 Best:          37w 0d    Det. By:   U/S (05/31/12)           EDD:   10/02/12
Anatomy

 Cranium:          Previously seen        Aortic Arch:      Previously seen
 Fetal Cavum:      Previously seen        Ductal Arch:      Not well visualized
 Ventricles:       Previously seen        Diaphragm:        Previously seen
 Choroid Plexus:   Previously seen        Stomach:          Appears normal
 Cerebellum:       Previously seen        Abdomen:          Appears normal
 Posterior Fossa:  Previously seen        Abdominal Wall:   Previously seen
 Nuchal Fold:      Not applicable (>20    Cord Vessels:     Previously seen
                   wks GA)
 Face:             Orbits previously      Kidneys:          Appear normal
                   seen
 Lips:             Appears normal         Bladder:          Appears normal
 Heart:            Not well visualized    Spine:            Previously seen
 RVOT:             Appears normal         Lower             Previously seen
                                          Extremities:
 LVOT:             Previously seen        Upper             Previously seen
                                          Extremities:

 Other:  Gender not well visualized. Heels and 5th digit previously visualized.
         Technically difficult due to maternal habitus, advanced GA and fetal
         position.
Cervix Uterus Adnexa

 Cervix:       Not visualized (advanced GA >34 wks)
 Uterus:       No abnormality visualized.
 Cul De Sac:   No free fluid seen.
 Left Ovary:   Not visualized.
 Right Ovary:  Not visualized.

 Adnexa:     No abnormality visualized.
Impression

 Single live IUP in cephalic presentation.  Concordant
 measurements/assigned GA by US.
 No late-developing anomaly in visualized structures above.
 Subjectively and quantitatively decreased amniotic fluid
 volume. Cervix open at real time imaging per discussion with
 sonographer Vemuri. Called to Dr. Kelvin Truong who was then
 present to review exam and talk to patient regarding results
 and management.

## 2014-08-25 LAB — CULTURE, OB URINE: Urine Culture, OB: NEGATIVE

## 2014-08-25 LAB — OB RESULTS CONSOLE HIV ANTIBODY (ROUTINE TESTING): HIV: NONREACTIVE

## 2014-08-25 LAB — OB RESULTS CONSOLE GC/CHLAMYDIA
Chlamydia: NEGATIVE
GC PROBE AMP, GENITAL: NEGATIVE

## 2014-08-25 LAB — OB RESULTS CONSOLE PLATELET COUNT: Platelets: 246 10*3/uL

## 2014-08-25 LAB — CYSTIC FIBROSIS DIAGNOSTIC STUDY: Interpretation-CFDNA:: NEGATIVE

## 2014-08-25 LAB — GLUCOSE TOLERANCE, 1 HOUR (50G) W/O FASTING: GLUCOSE 1 HR PRENATAL, POC: 85 mg/dL

## 2014-08-25 LAB — OB RESULTS CONSOLE RPR: RPR: NONREACTIVE

## 2014-08-25 LAB — OB RESULTS CONSOLE HGB/HCT, BLOOD
HEMATOCRIT: 41 %
Hemoglobin: 13.1 g/dL

## 2014-08-25 LAB — OB RESULTS CONSOLE RUBELLA ANTIBODY, IGM: Rubella: IMMUNE

## 2014-08-25 LAB — OB RESULTS CONSOLE HEPATITIS B SURFACE ANTIGEN: Hepatitis B Surface Ag: NEGATIVE

## 2014-08-25 LAB — OB RESULTS CONSOLE ANTIBODY SCREEN: Antibody Screen: NEGATIVE

## 2014-08-26 ENCOUNTER — Other Ambulatory Visit (HOSPITAL_COMMUNITY): Payer: Self-pay | Admitting: Nurse Practitioner

## 2014-08-26 DIAGNOSIS — Z3689 Encounter for other specified antenatal screening: Secondary | ICD-10-CM

## 2014-08-26 DIAGNOSIS — Z3A22 22 weeks gestation of pregnancy: Secondary | ICD-10-CM

## 2014-09-01 DIAGNOSIS — B009 Herpesviral infection, unspecified: Secondary | ICD-10-CM

## 2014-09-01 DIAGNOSIS — O09899 Supervision of other high risk pregnancies, unspecified trimester: Secondary | ICD-10-CM | POA: Insufficient documentation

## 2014-09-01 DIAGNOSIS — O09892 Supervision of other high risk pregnancies, second trimester: Secondary | ICD-10-CM

## 2014-09-01 DIAGNOSIS — Z8759 Personal history of other complications of pregnancy, childbirth and the puerperium: Secondary | ICD-10-CM

## 2014-09-01 DIAGNOSIS — O0932 Supervision of pregnancy with insufficient antenatal care, second trimester: Secondary | ICD-10-CM

## 2014-09-01 DIAGNOSIS — E669 Obesity, unspecified: Secondary | ICD-10-CM

## 2014-09-02 ENCOUNTER — Ambulatory Visit (HOSPITAL_COMMUNITY): Admission: RE | Admit: 2014-09-02 | Payer: Medicaid Other | Source: Ambulatory Visit

## 2014-09-02 DIAGNOSIS — O169 Unspecified maternal hypertension, unspecified trimester: Secondary | ICD-10-CM

## 2014-09-04 ENCOUNTER — Encounter: Payer: Self-pay | Admitting: Obstetrics & Gynecology

## 2014-09-05 ENCOUNTER — Ambulatory Visit (HOSPITAL_COMMUNITY)
Admission: RE | Admit: 2014-09-05 | Discharge: 2014-09-05 | Disposition: A | Discharge status: Home / Self Care | Payer: Medicaid Other | Source: Ambulatory Visit | Attending: Nurse Practitioner | Admitting: Nurse Practitioner

## 2014-09-05 ENCOUNTER — Other Ambulatory Visit (HOSPITAL_COMMUNITY): Payer: Self-pay | Admitting: Nurse Practitioner

## 2014-09-05 DIAGNOSIS — Z3689 Encounter for other specified antenatal screening: Secondary | ICD-10-CM

## 2014-09-05 DIAGNOSIS — Z3A17 17 weeks gestation of pregnancy: Secondary | ICD-10-CM

## 2014-09-05 DIAGNOSIS — Z36 Encounter for antenatal screening of mother: Secondary | ICD-10-CM | POA: Diagnosis not present

## 2014-09-05 DIAGNOSIS — Z3A22 22 weeks gestation of pregnancy: Secondary | ICD-10-CM

## 2014-09-05 DIAGNOSIS — O99212 Obesity complicating pregnancy, second trimester: Secondary | ICD-10-CM | POA: Diagnosis not present

## 2014-09-05 DIAGNOSIS — E669 Obesity, unspecified: Secondary | ICD-10-CM | POA: Insufficient documentation

## 2014-09-29 ENCOUNTER — Encounter: Payer: Self-pay | Admitting: Obstetrics & Gynecology

## 2014-09-29 ENCOUNTER — Ambulatory Visit (INDEPENDENT_AMBULATORY_CARE_PROVIDER_SITE_OTHER): Payer: Medicaid Other | Admitting: Obstetrics & Gynecology

## 2014-09-29 VITALS — BP 132/86 | HR 86 | Temp 98.9°F | Wt 237.4 lb

## 2014-09-29 DIAGNOSIS — O162 Unspecified maternal hypertension, second trimester: Secondary | ICD-10-CM | POA: Diagnosis not present

## 2014-09-29 DIAGNOSIS — I10 Essential (primary) hypertension: Secondary | ICD-10-CM | POA: Insufficient documentation

## 2014-09-29 LAB — POCT URINALYSIS DIP (DEVICE)
Glucose, UA: NEGATIVE mg/dL
Hgb urine dipstick: NEGATIVE
NITRITE: NEGATIVE
PH: 6 (ref 5.0–8.0)
Protein, ur: >=300 mg/dL — AB
Urobilinogen, UA: 1 mg/dL (ref 0.0–1.0)

## 2014-09-29 MED ORDER — ASPIRIN EC 81 MG PO TBEC: 81.0000 mg | DELAYED_RELEASE_TABLET | Freq: Every day | ORAL | Status: DC

## 2014-09-29 NOTE — Progress Notes (Signed)
Nutrition note: 1st visit consult Pt has h/o obesity. Pt has gained 7.4# @ [redacted]w[redacted]d, which is wnl. Pt reports eating 5-6x/d. Pt is taking a PNV. Pt reports some N&V. Pt received verbal & written education on general nutrition during pregnancy. Discussed tips to decrease N&V. Discussed wt gain goals of 11-20# or 0.5#/wk. Pt agrees to continue taking a PNV. Pt does not have WIC but plans to apply. Pt plans to BF. F/u as needed Blondell Reveal, MS, RD, LDN, [facility]

## 2014-09-29 NOTE — Patient Instructions (Signed)

## 2014-09-29 NOTE — Progress Notes (Signed)
Initial OB appointment Breastfeeding tip of week reviewed Declined flu vaccine Bilirubin :small, Ketones: trace, leukocytes: trace

## 2014-09-29 NOTE — Progress Notes (Signed)
   Subjective: HD transfer CHTN, h/o SGA     Donna Thomas is a [id number] [redacted]w[redacted]d being seen today for her first obstetrical visit.  Her obstetrical history is significant for SGA babies and H/O preeclampsia. Patient does intend to breast feed. Pregnancy history fully reviewed.  Patient reports no complaints.  Filed Vitals:   09/29/14 0922  BP: 132/86  Pulse: 86  Temp: 98.9 F (37.2 C)  Weight: 237 lb 6.4 oz (107.684 kg)    HISTORY: OB History  Gravida Para Term Preterm AB SAB TAB Ectopic Multiple Living  # Outcome Date GA Lbr Len/2nd Weight Sex Delivery Anes PTL Lv  3 Current           2 Term 09/12/12 [redacted]w[redacted]d / 00:03 5 lb 8.9 oz (2.52 kg) F Vag-Spont EPI  Y  1 Term 04/27/06 [redacted]w[redacted]d  5 lb 5 oz (2.41 kg) F         Past Medical History  Diagnosis Date  . Infection   . Headache(784.0)   . Complication of anesthesia   . Hypertension   . Pregnancy induced hypertension    History reviewed. No pertinent past surgical history. Family History  Problem Relation Age of Onset  . Diabetes Maternal Grandmother   . Cancer Maternal Grandmother      Exam    Uterus:  Fundal Height: 22 cm  Pelvic Exam:                               System: Breast:      Skin: normal coloration and turgor, no rashes    Neurologic: oriented, normal mood   Extremities: normal strength, tone, and muscle mass   HEENT extra ocular movement intact   Mouth/Teeth dental hygiene good   Neck supple   Cardiovascular: regular rate and rhythm   Respiratory:  appears well, vitals normal, no respiratory distress, acyanotic, normal RR   Abdomen: soft, non-tender; bowel sounds normal; no masses,  no organomegaly   Urinary:        Assessment:    Pregnancy: [id number] Patient Active Problem List   Diagnosis Date Noted  . Hypertension affecting pregnancy, antepartum 09/29/2014  . Late prenatal care affecting pregnancy in second trimester 09/01/2014  . Obesity 09/01/2014  . Prior  pregnancy complicated by [facility], antepartum 09/01/2014  . Herpes 09/01/2014  . History of prior pregnancy with SGA newborn 09/01/2014  . ASCUS with positive high risk HPV 05/31/2012  . Prior pregnancy complicated by SGA (small for gestational age), antepartum 05/22/2012        Plan:     Initial labs drawn. Prenatal vitamins. Problem list reviewed and updated. Genetic Screening discussed Quad Screen: needs result from 08/20/14.  Ultrasound discussed; fetal survey: results reviewed.  Follow up in 2 weeks. 50% of 30 min visit spent on counseling and coordination of care.  24 hr urine due to proteinuria today   ARNOLD,JAMES 09/29/2014

## 2014-09-30 LAB — COMPREHENSIVE METABOLIC PANEL
ALBUMIN: 3.5 g/dL — AB (ref 3.6–5.1)
ALK PHOS: 61 U/L (ref 33–115)
ALT: 8 U/L (ref 6–29)
AST: 11 U/L (ref 10–30)
BUN: 8 mg/dL (ref 7–25)
CO2: 21 mmol/L (ref 20–31)
CREATININE: 0.53 mg/dL (ref 0.50–1.10)
Calcium: 8.6 mg/dL (ref 8.6–10.2)
Chloride: 104 mmol/L (ref 98–110)
Glucose, Bld: 91 mg/dL (ref 65–99)
POTASSIUM: 3.9 mmol/L (ref 3.5–5.3)
Sodium: 136 mmol/L (ref 135–146)
TOTAL PROTEIN: 6.3 g/dL (ref 6.1–8.1)
Total Bilirubin: 0.4 mg/dL (ref 0.2–1.2)

## 2014-09-30 NOTE — Progress Notes (Signed)
9/13  1340  Pt returned today for lab draw and brought 24 hr urine collection. The total volume of urine was 150 ml. Pt was questioned extensively by phlebotomist Jayme Cloud. She confirms that she collected for a full 24 hrs and did not miss any voids. She also stated that "This happened with my last pregnancy also. I feel like I need to pee but I only dribble".  Specimen was showed to Wynelle Bourgeois, CNM and instructions received to send to lab as ordered. Blood drawn for CMET.

## 2014-10-04 LAB — PROTEIN, URINE, 24 HOUR
Protein, 24H Urine: 32 mg/d (ref <150)
Protein, Urine: 21 mg/dL (ref 5–24)

## 2014-10-04 LAB — CREATININE CLEARANCE, URINE, 24 HOUR
CREAT CLEAR: 57 mL/min — AB (ref 75–115)
CREATININE: 0.53 mg/dL (ref 0.50–1.10)
Creatinine, 24H Ur: 436 mg/d — ABNORMAL LOW (ref 700–1800)
Creatinine, Urine: 290.5 mg/dL

## 2014-10-13 ENCOUNTER — Ambulatory Visit (INDEPENDENT_AMBULATORY_CARE_PROVIDER_SITE_OTHER): Payer: Medicaid Other | Admitting: Obstetrics & Gynecology

## 2014-10-13 VITALS — BP 130/75 | HR 84 | Temp 98.5°F | Wt 242.6 lb

## 2014-10-13 DIAGNOSIS — E559 Vitamin D deficiency, unspecified: Secondary | ICD-10-CM

## 2014-10-13 DIAGNOSIS — O162 Unspecified maternal hypertension, second trimester: Secondary | ICD-10-CM

## 2014-10-13 DIAGNOSIS — Z23 Encounter for immunization: Secondary | ICD-10-CM | POA: Diagnosis not present

## 2014-10-13 DIAGNOSIS — O09292 Supervision of pregnancy with other poor reproductive or obstetric history, second trimester: Secondary | ICD-10-CM

## 2014-10-13 LAB — POCT URINALYSIS DIP (DEVICE)
BILIRUBIN URINE: NEGATIVE
Glucose, UA: NEGATIVE mg/dL
Hgb urine dipstick: NEGATIVE
Ketones, ur: NEGATIVE mg/dL
Nitrite: NEGATIVE
PH: 6 (ref 5.0–8.0)
PROTEIN: NEGATIVE mg/dL
Specific Gravity, Urine: 1.025 (ref 1.005–1.030)
Urobilinogen, UA: 1 mg/dL (ref 0.0–1.0)

## 2014-10-13 NOTE — Progress Notes (Signed)
Subjective:  Donna Thomas is a 29 y.o. G3P2002 at [redacted]w[redacted]d being seen today for ongoing prenatal care.  Patient reports some dizziness with change in position as well as when she had vomiting.  Pt encourage to get up slowly and keep up fluids..  Contractions: Not present.  Vag. Bleeding: None. Movement: Present. Denies leaking of fluid.   The following portions of the patient's history were reviewed and updated as appropriate: allergies, current medications, past family history, past medical history, past social history, past surgical history and problem list.   Objective:   Filed Vitals:   10/13/14 1111  BP: 130/75  Pulse: 84  Temp: 98.5 F (36.9 C)  Weight: 242 lb 9.6 oz (110.043 kg)    Fetal Status: Fetal Heart Rate (bpm): 154   Movement: Present     General:  Alert, oriented and cooperative. Patient is in no acute distress.  Skin: Skin is warm and dry. No rash noted.   Cardiovascular: Normal heart rate noted  Respiratory: Normal respiratory effort, no problems with respiration noted  Abdomen: Soft, gravid, appropriate for gestational age. Pain/Pressure: Present     Pelvic: Vag. Bleeding: None     Cervical exam deferred        Extremities: Normal range of motion.  Edema: None  Mental Status: Normal mood and affect. Normal behavior. Normal judgment and thought content.   Urinalysis: Urine Protein: Negative Urine Glucose: Negative  Assessment and Plan:  Pregnancy: G3P2002 at [redacted]w[redacted]d  1. Hypertension affecting pregnancy, antepartum, second trimester -Needs to recollect 24 hour urine. - US OB Follow Up; Future - AFP, Quad Screen (redraw today as the first specimen had wrong EDC and drawn to early)  2. Avitaminosis D - Vit D  25 hydroxy (rtn osteoporosis monitoring)  3. Flu vaccine need - Flu Vaccine QUAD 36+ mos IM; Standing - Flu Vaccine QUAD 36+ mos IM  4. Prior pregnancy complicated by SGA (small for gestational age), antepartum, second trimester Serial [place]  Preterm  labor symptoms and general obstetric precautions including but not limited to vaginal bleeding, contractions, leaking of fluid and fetal movement were reviewed in detail with the patient. Please refer to After Visit Summary for other counseling recommendations.  Return in about 3 weeks (around 11/03/2014).   Lesly Dukes, MD

## 2014-10-13 NOTE — Progress Notes (Signed)
Breastfeeding tip of the week reviewed Leukocytes: small 

## 2014-10-14 ENCOUNTER — Encounter (HOSPITAL_COMMUNITY): Payer: Self-pay | Admitting: *Deleted

## 2014-10-14 LAB — VITAMIN D 25 HYDROXY (VIT D DEFICIENCY, FRACTURES): Vit D, 25-Hydroxy: 22 ng/mL — ABNORMAL LOW (ref 30–100)

## 2014-10-15 ENCOUNTER — Other Ambulatory Visit: Payer: Self-pay | Admitting: Obstetrics & Gynecology

## 2014-10-15 MED ORDER — VITAMIN D (ERGOCALCIFEROL) 1.25 MG (50000 UNIT) PO CAPS: 50000.0000 [IU] | ORAL_CAPSULE | ORAL | Status: AC

## 2014-10-15 NOTE — Progress Notes (Signed)
Pt has low vit D.  See Rx.  Recheck vit d in 8 weeks

## 2014-10-16 LAB — AFP, QUAD SCREEN
AFP: 56.8 ng/mL
Age Alone: 1:706 {titer}
CURR GEST AGE: 23.1 wks.days
Down Syndrome Scr Risk Est: 1:691 {titer}
HCG, Total: 32.54 IU/mL
INH: 184.9 pg/mL
Interpretation-AFP: NEGATIVE
MoM for AFP: 0.61
MoM for INH: 0.7
MoM for hCG: 1.67
Open Spina bifida: NEGATIVE
Osb Risk: <1:27300 {titer}
Tri 18 Scr Risk Est: NEGATIVE
UE3 MOM: 0.97
UE3 VALUE: 2.85 ng/mL

## 2014-10-20 NOTE — Progress Notes (Signed)
Contacted Patient, informed of low Vitamin D level and prescription.  Pt verbalizes understanding.

## 2014-10-21 ENCOUNTER — Other Ambulatory Visit: Payer: Self-pay | Admitting: Obstetrics & Gynecology

## 2014-10-21 ENCOUNTER — Ambulatory Visit (HOSPITAL_COMMUNITY)
Admission: RE | Admit: 2014-10-21 | Discharge: 2014-10-21 | Disposition: A | Discharge status: Home / Self Care | Payer: Medicaid Other | Source: Ambulatory Visit | Attending: Obstetrics & Gynecology | Admitting: Obstetrics & Gynecology

## 2014-10-21 ENCOUNTER — Other Ambulatory Visit: Payer: Medicaid Other

## 2014-10-21 DIAGNOSIS — E669 Obesity, unspecified: Secondary | ICD-10-CM | POA: Diagnosis not present

## 2014-10-21 DIAGNOSIS — Z36 Encounter for antenatal screening of mother: Secondary | ICD-10-CM | POA: Insufficient documentation

## 2014-10-21 DIAGNOSIS — IMO0002 Reserved for concepts with insufficient information to code with codable children: Secondary | ICD-10-CM

## 2014-10-21 DIAGNOSIS — Z3A24 24 weeks gestation of pregnancy: Secondary | ICD-10-CM

## 2014-10-21 DIAGNOSIS — Z0489 Encounter for examination and observation for other specified reasons: Secondary | ICD-10-CM

## 2014-10-21 DIAGNOSIS — O132 Gestational [pregnancy-induced] hypertension without significant proteinuria, second trimester: Secondary | ICD-10-CM | POA: Insufficient documentation

## 2014-10-21 DIAGNOSIS — O09292 Supervision of pregnancy with other poor reproductive or obstetric history, second trimester: Secondary | ICD-10-CM

## 2014-10-21 DIAGNOSIS — O99212 Obesity complicating pregnancy, second trimester: Secondary | ICD-10-CM | POA: Insufficient documentation

## 2014-10-21 DIAGNOSIS — O162 Unspecified maternal hypertension, second trimester: Secondary | ICD-10-CM

## 2014-10-21 LAB — COMPREHENSIVE METABOLIC PANEL
ALT: 9 U/L (ref 6–29)
AST: 13 U/L (ref 10–30)
Albumin: 3.3 g/dL — ABNORMAL LOW (ref 3.6–5.1)
Alkaline Phosphatase: 64 U/L (ref 33–115)
BUN: 6 mg/dL — AB (ref 7–25)
CHLORIDE: 105 mmol/L (ref 98–110)
CO2: 23 mmol/L (ref 20–31)
Calcium: 8.7 mg/dL (ref 8.6–10.2)
Creat: 0.6 mg/dL (ref 0.50–1.10)
GLUCOSE: 81 mg/dL (ref 65–99)
POTASSIUM: 4.1 mmol/L (ref 3.5–5.3)
Sodium: 135 mmol/L (ref 135–146)
Total Bilirubin: 0.4 mg/dL (ref 0.2–1.2)
Total Protein: 6.3 g/dL (ref 6.1–8.1)

## 2014-10-21 LAB — CBC
HEMATOCRIT: 36.7 % (ref 36.0–46.0)
Hemoglobin: 12.4 g/dL (ref 12.0–15.0)
MCH: 29.1 pg (ref 26.0–34.0)
MCHC: 33.8 g/dL (ref 30.0–36.0)
MCV: 86.2 fL (ref 78.0–100.0)
MPV: 10.7 fL (ref 8.6–12.4)
Platelets: 266 10*3/uL (ref 150–400)
RBC: 4.26 MIL/uL (ref 3.87–5.11)
RDW: 13 % (ref 11.5–15.5)
WBC: 6.7 10*3/uL (ref 4.0–10.5)

## 2014-10-21 NOTE — Addendum Note (Signed)
Addended by: Sherre Lain A on: 10/21/2014 02:16 PM   Modules accepted: Orders

## 2014-10-22 LAB — CREATININE CLEARANCE, URINE, 24 HOUR
CREAT CLEAR: 54 mL/min — AB (ref 75–115)
CREATININE, URINE: 98.3 mg/dL
CREATININE: 0.6 mg/dL (ref 0.50–1.10)
Creatinine, 24H Ur: 467 mg/d — ABNORMAL LOW (ref 700–1800)

## 2014-10-22 LAB — PROTEIN, URINE, 24 HOUR
PROTEIN, URINE: 9 mg/dL (ref 5–24)
Protein, 24H Urine: 43 mg/d (ref <150)

## 2014-11-03 ENCOUNTER — Ambulatory Visit (INDEPENDENT_AMBULATORY_CARE_PROVIDER_SITE_OTHER): Payer: Medicaid Other | Admitting: Obstetrics and Gynecology

## 2014-11-03 ENCOUNTER — Encounter: Payer: Self-pay | Admitting: Obstetrics and Gynecology

## 2014-11-03 VITALS — BP 130/76 | HR 90 | Temp 98.9°F | Wt 235.6 lb

## 2014-11-03 DIAGNOSIS — E669 Obesity, unspecified: Secondary | ICD-10-CM

## 2014-11-03 DIAGNOSIS — O162 Unspecified maternal hypertension, second trimester: Secondary | ICD-10-CM | POA: Diagnosis not present

## 2014-11-03 DIAGNOSIS — O99342 Other mental disorders complicating pregnancy, second trimester: Secondary | ICD-10-CM

## 2014-11-03 DIAGNOSIS — O9912 Other diseases of the blood and blood-forming organs and certain disorders involving the immune mechanism complicating childbirth: Secondary | ICD-10-CM | POA: Diagnosis not present

## 2014-11-03 DIAGNOSIS — O09892 Supervision of other high risk pregnancies, second trimester: Secondary | ICD-10-CM

## 2014-11-03 DIAGNOSIS — O09292 Supervision of pregnancy with other poor reproductive or obstetric history, second trimester: Secondary | ICD-10-CM

## 2014-11-03 DIAGNOSIS — O98312 Other infections with a predominantly sexual mode of transmission complicating pregnancy, second trimester: Secondary | ICD-10-CM

## 2014-11-03 DIAGNOSIS — O0932 Supervision of pregnancy with insufficient antenatal care, second trimester: Secondary | ICD-10-CM

## 2014-11-03 DIAGNOSIS — A6000 Herpesviral infection of urogenital system, unspecified: Secondary | ICD-10-CM | POA: Diagnosis not present

## 2014-11-03 DIAGNOSIS — IMO0002 Reserved for concepts with insufficient information to code with codable children: Secondary | ICD-10-CM

## 2014-11-03 DIAGNOSIS — F309 Manic episode, unspecified: Secondary | ICD-10-CM | POA: Diagnosis not present

## 2014-11-03 LAB — POCT URINALYSIS DIP (DEVICE)
GLUCOSE, UA: 100 mg/dL — AB
NITRITE: NEGATIVE
PROTEIN: 30 mg/dL — AB
Specific Gravity, Urine: >=1.03 (ref 1.005–1.030)
UROBILINOGEN UA: 1 mg/dL (ref 0.0–1.0)
pH: 6.5 (ref 5.0–8.0)

## 2014-11-03 NOTE — Progress Notes (Signed)
Subjective:  Donna Thomas is a 29 y.o. G3P2002 at 3113w1d being seen today for ongoing prenatal care.  Patient reports no complaints.  Contractions: Not present.  Vag. Bleeding: None. Movement: Present. Denies leaking of fluid.   The following portions of the patient's history were reviewed and updated as appropriate: allergies, current medications, past family history, past medical history, past social history, past surgical history and problem list. Problem list updated.  Objective:   Filed Vitals:   11/03/14 1004  BP: 130/76  Pulse: 90  Temp: 98.9 F (37.2 C)  Weight: 235 lb 9.6 oz (106.867 kg)    Fetal Status: Fetal Heart Rate (bpm): 145   Movement: Present   Fundal height 26 cm  General:  Alert, oriented and cooperative. Patient is in no acute distress.  Skin: Skin is warm and dry. No rash noted.   Cardiovascular: Normal heart rate noted  Respiratory: Normal respiratory effort, no problems with respiration noted  Abdomen: Soft, gravid, appropriate for gestational age. Pain/Pressure: Present     Pelvic: Vag. Bleeding: None     Cervical exam deferred        Extremities: Normal range of motion.  Edema: None  Mental Status: Normal mood and affect. Normal behavior. Normal judgment and thought content.   Urinalysis: Urine Protein: Trace Urine Glucose: 1+  Assessment and Plan:  Pregnancy: G3P2002 at 7013w1d  1. Prior pregnancy complicated by SGA (small for gestational age), antepartum, second trimester EFW 49%tile on 10/21/2014 Schedule follow up in November 1hr GCT and labs next visit  2. Obesity Good weight gain thus far in pregnancy  3. Late prenatal care affecting pregnancy in second trimester - Culture, OB Urine  5. Hypertension affecting pregnancy, antepartum, second trimester Normotensive, no meds Continue monitoring  6. Genital herpes Will start prophylaxis in third trimester  7. Bipolar I disorder, single manic episode (HCC)   Preterm labor symptoms and  general obstetric precautions including but not limited to vaginal bleeding, contractions, leaking of fluid and fetal movement were reviewed in detail with the patient. Please refer to After Visit Summary for other counseling recommendations.  Return in about 2 weeks (around 11/17/2014).   Catalina AntiguaPeggy Luretta Everly, MD

## 2014-11-03 NOTE — Progress Notes (Signed)
Urine: large amt wbcs Breastfeeding tip of the week reviewed

## 2014-11-04 LAB — CULTURE, OB URINE

## 2014-11-10 ENCOUNTER — Encounter: Payer: Self-pay | Admitting: *Deleted

## 2014-11-10 ENCOUNTER — Telehealth: Payer: Self-pay | Admitting: *Deleted

## 2014-11-10 NOTE — Telephone Encounter (Signed)
Per Dr. Jolayne Pantheronstant need hepatitis B results- called health department - no answer, need to call back as we have not received this result per chart review and review of records not yet scanned in.

## 2014-11-11 NOTE — Telephone Encounter (Signed)
Negative result obtained from HD. Results were faxed to us and abstracted.

## 2014-11-14 ENCOUNTER — Encounter: Payer: Self-pay | Admitting: *Deleted

## 2014-11-17 ENCOUNTER — Ambulatory Visit (INDEPENDENT_AMBULATORY_CARE_PROVIDER_SITE_OTHER): Payer: Medicaid Other | Admitting: Family Medicine

## 2014-11-17 ENCOUNTER — Encounter: Payer: Self-pay | Admitting: Family Medicine

## 2014-11-17 ENCOUNTER — Encounter: Payer: Medicaid Other | Admitting: Family Medicine

## 2014-11-17 VITALS — BP 120/83 | HR 78 | Wt 235.0 lb

## 2014-11-17 DIAGNOSIS — R896 Abnormal cytological findings in specimens from other organs, systems and tissues: Secondary | ICD-10-CM | POA: Diagnosis not present

## 2014-11-17 DIAGNOSIS — O163 Unspecified maternal hypertension, third trimester: Secondary | ICD-10-CM

## 2014-11-17 DIAGNOSIS — E559 Vitamin D deficiency, unspecified: Secondary | ICD-10-CM

## 2014-11-17 DIAGNOSIS — F309 Manic episode, unspecified: Secondary | ICD-10-CM

## 2014-11-17 DIAGNOSIS — O99343 Other mental disorders complicating pregnancy, third trimester: Secondary | ICD-10-CM | POA: Diagnosis not present

## 2014-11-17 DIAGNOSIS — O0990 Supervision of high risk pregnancy, unspecified, unspecified trimester: Secondary | ICD-10-CM | POA: Insufficient documentation

## 2014-11-17 DIAGNOSIS — Z23 Encounter for immunization: Secondary | ICD-10-CM

## 2014-11-17 DIAGNOSIS — O0993 Supervision of high risk pregnancy, unspecified, third trimester: Secondary | ICD-10-CM

## 2014-11-17 DIAGNOSIS — IMO0002 Reserved for concepts with insufficient information to code with codable children: Secondary | ICD-10-CM

## 2014-11-17 LAB — CBC
HCT: 36 % (ref 36.0–46.0)
Hemoglobin: 12.1 g/dL (ref 12.0–15.0)
MCH: 28.5 pg (ref 26.0–34.0)
MCHC: 33.6 g/dL (ref 30.0–36.0)
MCV: 84.9 fL (ref 78.0–100.0)
MPV: 10.6 fL (ref 8.6–12.4)
Platelets: 265 10*3/uL (ref 150–400)
RBC: 4.24 MIL/uL (ref 3.87–5.11)
RDW: 13.1 % (ref 11.5–15.5)
WBC: 5.8 10*3/uL (ref 4.0–10.5)

## 2014-11-17 LAB — POCT URINALYSIS DIP (DEVICE)
Glucose, UA: NEGATIVE mg/dL
HGB URINE DIPSTICK: NEGATIVE
Ketones, ur: NEGATIVE mg/dL
Nitrite: NEGATIVE
PH: 6.5 (ref 5.0–8.0)
Protein, ur: 30 mg/dL — AB
SPECIFIC GRAVITY, URINE: 1.025 (ref 1.005–1.030)
Urobilinogen, UA: 1 mg/dL (ref 0.0–1.0)

## 2014-11-17 MED ORDER — TETANUS-DIPHTH-ACELL PERTUSSIS 5-2.5-18.5 LF-MCG/0.5 IM SUSP
0.5000 mL | Freq: Once | INTRAMUSCULAR | Status: AC
  Administered 2014-11-17: 0.5 mL via INTRAMUSCULAR

## 2014-11-17 NOTE — Progress Notes (Signed)
Subjective:  Donna Thomas is a 29 y.o. G3P2002 at 2673w1d being seen today for ongoing prenatal care.  Patient reports no complaints.  Contractions: Not present.  Vag. Bleeding: None. Movement: Present. Denies leaking of fluid.   The following portions of the patient's history were reviewed and updated as appropriate: allergies, current medications, past family history, past medical history, past social history, past surgical history and problem list. Problem list updated.  Objective:   Filed Vitals:   11/17/14 1023  BP: 120/83  Pulse: 78  Weight: 235 lb (106.595 kg)    Fetal Status: Fetal Heart Rate (bpm): 145   Movement: Present     General:  Alert, oriented and cooperative. Patient is in no acute distress.  Skin: Skin is warm and dry. No rash noted.   Cardiovascular: Normal heart rate noted  Respiratory: Normal respiratory effort, no problems with respiration noted  Abdomen: Soft, gravid, appropriate for gestational age. Pain/Pressure: Absent     Pelvic: Vag. Bleeding: None     Cervical exam deferred        Extremities: Normal range of motion.  Edema: None  Mental Status: Normal mood and affect. Normal behavior. Normal judgment and thought content.   Urinalysis:      Assessment and Plan:  Pregnancy: G3P2002 at 7873w1d  1. Supervision of high risk pregnancy, third trimester - Rubella Antibody, IgM - Hepatitis B Surface AntiGEN - Glucose Tolerance, 1 HR (50g) w/o Fasting - CBC - RPR - HIV antibody (with reflex) - Tdap (BOOSTRIX) injection 0.5 mL; Inject 0.5 mLs into the muscle once. - needs PAP results   2. Hypertension affecting pregnancy, antepartum, third trimester - BP wnl today  3. Bipolar I disorder, single manic episode (HCC) -monitor for mood sx  Preterm labor symptoms and general obstetric precautions including but not limited to vaginal bleeding, contractions, leaking of fluid and fetal movement were reviewed in detail with the patient. Please refer to After  Visit Summary for other counseling recommendations.  Return in about 4 weeks (around 12/15/2014) for Routine prenatal care.   Federico FlakeKimberly Niles Justan Gaede, MD

## 2014-11-17 NOTE — Patient Instructions (Signed)

## 2014-11-18 ENCOUNTER — Encounter: Payer: Medicaid Other | Admitting: Obstetrics and Gynecology

## 2014-11-18 LAB — HIV ANTIBODY (ROUTINE TESTING W REFLEX): HIV: NONREACTIVE

## 2014-11-18 LAB — RPR

## 2014-11-18 LAB — GLUCOSE TOLERANCE, 1 HOUR (50G) W/O FASTING: GLUCOSE 1 HOUR GTT: 94 mg/dL (ref 70–140)

## 2014-11-18 LAB — HEPATITIS B SURFACE ANTIGEN: HEP B S AG: NEGATIVE

## 2014-11-18 LAB — VITAMIN D 25 HYDROXY (VIT D DEFICIENCY, FRACTURES): Vit D, 25-Hydroxy: 33 ng/mL (ref 30–100)

## 2014-11-19 LAB — VITAMIN D 1,25 DIHYDROXY
VITAMIN D3 1, 25 (OH): 77 pg/mL
Vitamin D 1, 25 (OH)2 Total: 170 pg/mL — ABNORMAL HIGH (ref 18–72)
Vitamin D2 1, 25 (OH)2: 93 pg/mL

## 2014-11-20 ENCOUNTER — Encounter: Payer: Self-pay | Admitting: *Deleted

## 2014-11-20 ENCOUNTER — Telehealth: Payer: Self-pay | Admitting: *Deleted

## 2014-11-20 DIAGNOSIS — O0990 Supervision of high risk pregnancy, unspecified, unspecified trimester: Secondary | ICD-10-CM

## 2014-11-20 LAB — RUBELLA ANTIBODY, IGM: Rubella IgM: 0.66 (ref <0.91)

## 2014-11-20 NOTE — Telephone Encounter (Signed)
Pt left message requesting results of recent tests.

## 2014-11-24 NOTE — Telephone Encounter (Signed)
Called pt and LM that we returning her call to call the Clinics.

## 2014-11-25 NOTE — Telephone Encounter (Signed)
Pt returned call to the front desk and I informed her that her 28wk labs came back normal.  Pt asked if she should be concerned because she has had dizzy spells.  I advised pt to become aware of when she has dizzy spells.  Pt stated "no".  I explained to the pt to make slower movements from sitting, lying, or standing positions; make sure that she is well-hydrated and that she has eaten, and monitor blood pressure.  Pt stated understanding to the recommendations and will f/u as needed.

## 2014-12-18 ENCOUNTER — Ambulatory Visit (INDEPENDENT_AMBULATORY_CARE_PROVIDER_SITE_OTHER): Payer: Medicaid Other | Admitting: Obstetrics & Gynecology

## 2014-12-18 ENCOUNTER — Ambulatory Visit (HOSPITAL_COMMUNITY)
Admission: RE | Admit: 2014-12-18 | Discharge: 2014-12-18 | Disposition: A | Discharge status: Home / Self Care | Payer: Medicaid Other | Source: Ambulatory Visit | Attending: Obstetrics & Gynecology | Admitting: Obstetrics & Gynecology

## 2014-12-18 VITALS — BP 130/81 | HR 105 | Temp 98.0°F | Wt 237.0 lb

## 2014-12-18 DIAGNOSIS — O163 Unspecified maternal hypertension, third trimester: Secondary | ICD-10-CM

## 2014-12-18 DIAGNOSIS — O0993 Supervision of high risk pregnancy, unspecified, third trimester: Secondary | ICD-10-CM

## 2014-12-18 DIAGNOSIS — O288 Other abnormal findings on antenatal screening of mother: Secondary | ICD-10-CM

## 2014-12-18 DIAGNOSIS — O283 Abnormal ultrasonic finding on antenatal screening of mother: Secondary | ICD-10-CM | POA: Diagnosis present

## 2014-12-18 DIAGNOSIS — Z3A32 32 weeks gestation of pregnancy: Secondary | ICD-10-CM | POA: Diagnosis not present

## 2014-12-18 DIAGNOSIS — O09293 Supervision of pregnancy with other poor reproductive or obstetric history, third trimester: Secondary | ICD-10-CM | POA: Diagnosis not present

## 2014-12-18 DIAGNOSIS — O133 Gestational [pregnancy-induced] hypertension without significant proteinuria, third trimester: Secondary | ICD-10-CM | POA: Insufficient documentation

## 2014-12-18 DIAGNOSIS — O289 Unspecified abnormal findings on antenatal screening of mother: Secondary | ICD-10-CM

## 2014-12-18 LAB — POCT URINALYSIS DIP (DEVICE)
GLUCOSE, UA: NEGATIVE mg/dL
Hgb urine dipstick: NEGATIVE
NITRITE: NEGATIVE
Protein, ur: 30 mg/dL — AB
Specific Gravity, Urine: 1.025 (ref 1.005–1.030)
UROBILINOGEN UA: 1 mg/dL (ref 0.0–1.0)
pH: 6 (ref 5.0–8.0)

## 2014-12-18 NOTE — Progress Notes (Signed)
Needs to start prenatal testing Subjective:  Donna Thomas is a 29 y.o. [id number] at 248w4d being seen today for ongoing prenatal care.  She is currently monitored for the following issues for this high-risk pregnancy and has Prior pregnancy complicated by SGA (small for gestational age), antepartum; ASCUS with positive high risk HPV; Late prenatal care affecting pregnancy in second trimester; Obesity; Prior pregnancy complicated by PIH, antepartum; Herpes; History of prior pregnancy with SGA newborn; Hypertension affecting pregnancy, antepartum; Bipolar I disorder, single manic episode (HCC); Clinical depression; Genital herpes; Avitaminosis D; and Supervision of high risk pregnancy, antepartum on her problem list.  Patient reports no complaints.  Contractions: Not present. Vag. Bleeding: None.  Movement: Present. Denies leaking of fluid.   The following portions of the patient's history were reviewed and updated as appropriate: allergies, current medications, past family history, past medical history, past social history, past surgical history and problem list. Problem list updated.  Objective:   Filed Vitals:   12/18/14 1017  BP: 130/81  Pulse: 105  Temp: 98 F (36.7 C)  Weight: 237 lb (107.502 kg)    Fetal Status: Fetal Heart Rate (bpm): 152   Movement: Present     General:  Alert, oriented and cooperative. Patient is in no acute distress.  Skin: Skin is warm and dry. No rash noted.   Cardiovascular: Normal heart rate noted  Respiratory: Normal respiratory effort, no problems with respiration noted  Abdomen: Soft, gravid, appropriate for gestational age. Pain/Pressure: Present     Pelvic: Vag. Bleeding: None     Cervical exam deferred        Extremities: Normal range of motion.  Edema: None  Mental Status: Normal mood and affect. Normal behavior. Normal judgment and thought content.   Urinalysis:      Assessment and Plan:  Pregnancy: G3P2002 at 6048w4d  1. Supervision of high  risk pregnancy, antepartum, third trimester NST 2/week - US OB Follow Up; Future  Preterm labor symptoms and general obstetric precautions including but not limited to vaginal bleeding, contractions, leaking of fluid and fetal movement were reviewed in detail with the patient. Please refer to After Visit Summary for other counseling recommendations.  Return in about 1 week (around 12/25/2014).   Adam PhenixJames G Edwardo Wojnarowski, MD

## 2014-12-22 ENCOUNTER — Ambulatory Visit (INDEPENDENT_AMBULATORY_CARE_PROVIDER_SITE_OTHER): Payer: Medicaid Other | Admitting: *Deleted

## 2014-12-22 VITALS — BP 133/84 | HR 109

## 2014-12-22 DIAGNOSIS — O163 Unspecified maternal hypertension, third trimester: Secondary | ICD-10-CM

## 2014-12-25 ENCOUNTER — Ambulatory Visit (INDEPENDENT_AMBULATORY_CARE_PROVIDER_SITE_OTHER): Payer: Medicaid Other | Admitting: Obstetrics & Gynecology

## 2014-12-25 VITALS — BP 111/74 | HR 90 | Wt 230.1 lb

## 2014-12-25 DIAGNOSIS — Z36 Encounter for antenatal screening of mother: Secondary | ICD-10-CM | POA: Diagnosis not present

## 2014-12-25 DIAGNOSIS — O0993 Supervision of high risk pregnancy, unspecified, third trimester: Secondary | ICD-10-CM

## 2014-12-25 DIAGNOSIS — O163 Unspecified maternal hypertension, third trimester: Secondary | ICD-10-CM | POA: Diagnosis present

## 2014-12-25 LAB — POCT URINALYSIS DIP (DEVICE)
Glucose, UA: 100 mg/dL — AB
Hgb urine dipstick: NEGATIVE
Nitrite: NEGATIVE
Protein, ur: 100 mg/dL — AB
Urobilinogen, UA: 2 mg/dL — ABNORMAL HIGH (ref 0.0–1.0)
pH: 6 (ref 5.0–8.0)

## 2014-12-25 NOTE — Patient Instructions (Signed)
Return to clinic for any obstetric concerns or go to [facility] for evaluation  

## 2014-12-25 NOTE — Progress Notes (Signed)
Subjective:  Donna Thomas is a 29 y.o. [id number] at 9038w4d being seen today for ongoing prenatal care.  She is currently monitored for the following issues for this high-risk pregnancy and has Prior pregnancy complicated by SGA (small for gestational age), antepartum; ASCUS with positive high risk HPV; Late prenatal care affecting pregnancy in second trimester; Obesity; Prior pregnancy complicated by PIH, antepartum; Herpes; History of prior pregnancy with SGA newborn; Hypertension affecting pregnancy, antepartum; Bipolar I disorder, single manic episode (HCC); Clinical depression; Genital herpes; Avitaminosis D; and Supervision of high risk pregnancy, antepartum on her problem list.  Patient reports no complaints.  Contractions: Not present. Vag. Bleeding: None.  Movement: Present. Denies leaking of fluid.   The following portions of the patient's history were reviewed and updated as appropriate: allergies, current medications, past family history, past medical history, past social history, past surgical history and problem list. Problem list updated.  Objective:   Filed Vitals:   12/25/14 0748  BP: 111/74  Pulse: 90  Weight: 230 lb 1.6 oz (104.373 kg)    Fetal Status: Fetal Heart Rate (bpm): RNST Fundal Height: 34 cm Movement: Present     General:  Alert, oriented and cooperative. Patient is in no acute distress.  Skin: Skin is warm and dry. No rash noted.   Cardiovascular: Normal heart rate noted  Respiratory: Normal respiratory effort, no problems with respiration noted  Abdomen: Soft, gravid, appropriate for gestational age. Pain/Pressure: Absent     Pelvic: Vag. Bleeding: None    Cervical exam deferred        Extremities: Normal range of motion.  Edema: None  Mental Status: Normal mood and affect. Normal behavior. Normal judgment and thought content.   Urinalysis: Urine Protein: 2+ Urine Glucose: 1+ NST performed today was reviewed and was found to be reactive.  AFI normal at 9.9  cm.  Continue recommended antenatal testing and prenatal care.  Assessment and Plan:  Pregnancy: G3P2002 at 8238w4d  1. Hypertension affecting pregnancy, antepartum, third trimester 34 week scan ordered Stable BP, no meds. Continue antenatal testing as scheduled  Preeclampsia/preterm labor symptoms and general obstetric precautions including but not limited to vaginal bleeding, contractions, leaking of fluid and fetal movement were reviewed in detail with the patient. Please refer to After Visit Summary for other counseling recommendations.    Tereso NewcomerUgonna A Padme Arriaga, MD

## 2014-12-25 NOTE — Progress Notes (Signed)
Breastfeeding tip of the week reviewed. 

## 2014-12-29 ENCOUNTER — Other Ambulatory Visit: Payer: Medicaid Other

## 2014-12-30 ENCOUNTER — Ambulatory Visit (INDEPENDENT_AMBULATORY_CARE_PROVIDER_SITE_OTHER): Payer: Medicaid Other | Admitting: *Deleted

## 2014-12-30 ENCOUNTER — Other Ambulatory Visit: Payer: Medicaid Other

## 2014-12-30 VITALS — BP 116/72 | HR 92

## 2014-12-30 DIAGNOSIS — O163 Unspecified maternal hypertension, third trimester: Secondary | ICD-10-CM | POA: Diagnosis present

## 2014-12-30 NOTE — Progress Notes (Signed)
NST reviewed and reactive.  

## 2015-01-01 ENCOUNTER — Ambulatory Visit (INDEPENDENT_AMBULATORY_CARE_PROVIDER_SITE_OTHER): Payer: Medicaid Other | Admitting: Obstetrics & Gynecology

## 2015-01-01 ENCOUNTER — Other Ambulatory Visit: Payer: Self-pay | Admitting: Obstetrics & Gynecology

## 2015-01-01 ENCOUNTER — Ambulatory Visit (HOSPITAL_COMMUNITY)
Admission: RE | Admit: 2015-01-01 | Discharge: 2015-01-01 | Disposition: A | Discharge status: Home / Self Care | Payer: Medicaid Other | Source: Ambulatory Visit | Attending: Obstetrics & Gynecology | Admitting: Obstetrics & Gynecology

## 2015-01-01 VITALS — BP 117/73 | HR 95 | Wt 233.6 lb

## 2015-01-01 DIAGNOSIS — Z8759 Personal history of other complications of pregnancy, childbirth and the puerperium: Secondary | ICD-10-CM

## 2015-01-01 DIAGNOSIS — O163 Unspecified maternal hypertension, third trimester: Secondary | ICD-10-CM

## 2015-01-01 DIAGNOSIS — O0993 Supervision of high risk pregnancy, unspecified, third trimester: Secondary | ICD-10-CM

## 2015-01-01 DIAGNOSIS — O98319 Other infections with a predominantly sexual mode of transmission complicating pregnancy, unspecified trimester: Secondary | ICD-10-CM

## 2015-01-01 DIAGNOSIS — O139 Gestational [pregnancy-induced] hypertension without significant proteinuria, unspecified trimester: Secondary | ICD-10-CM

## 2015-01-01 DIAGNOSIS — O133 Gestational [pregnancy-induced] hypertension without significant proteinuria, third trimester: Secondary | ICD-10-CM

## 2015-01-01 DIAGNOSIS — Z3A34 34 weeks gestation of pregnancy: Secondary | ICD-10-CM

## 2015-01-01 DIAGNOSIS — O09299 Supervision of pregnancy with other poor reproductive or obstetric history, unspecified trimester: Secondary | ICD-10-CM

## 2015-01-01 DIAGNOSIS — A6009 Herpesviral infection of other urogenital tract: Secondary | ICD-10-CM

## 2015-01-01 DIAGNOSIS — A6000 Herpesviral infection of urogenital system, unspecified: Secondary | ICD-10-CM

## 2015-01-01 DIAGNOSIS — O99213 Obesity complicating pregnancy, third trimester: Secondary | ICD-10-CM

## 2015-01-01 DIAGNOSIS — O98313 Other infections with a predominantly sexual mode of transmission complicating pregnancy, third trimester: Secondary | ICD-10-CM | POA: Diagnosis not present

## 2015-01-01 DIAGNOSIS — O09293 Supervision of pregnancy with other poor reproductive or obstetric history, third trimester: Secondary | ICD-10-CM | POA: Diagnosis present

## 2015-01-01 LAB — POCT URINALYSIS DIP (DEVICE)
Glucose, UA: NEGATIVE mg/dL
HGB URINE DIPSTICK: NEGATIVE
Ketones, ur: NEGATIVE mg/dL
NITRITE: NEGATIVE
PH: 6 (ref 5.0–8.0)
PROTEIN: 30 mg/dL — AB
Specific Gravity, Urine: 1.025 (ref 1.005–1.030)
Urobilinogen, UA: 1 mg/dL (ref 0.0–1.0)

## 2015-01-01 MED ORDER — VALACYCLOVIR HCL 500 MG PO TABS: 500.0000 mg | ORAL_TABLET | Freq: Two times a day (BID) | ORAL | Status: DC

## 2015-01-01 NOTE — Progress Notes (Signed)
US for growth today.  Breastfeeding tip of the week reviewed.

## 2015-01-01 NOTE — Progress Notes (Signed)
Subjective:  Donna Thomas is a 29 y.o. [id number] at 593w4d being seen today for ongoing prenatal care.  She is currently monitored for the following issues for this high-risk pregnancy and has Prior pregnancy complicated by SGA (small for gestational age), antepartum; ASCUS with positive high risk HPV; Obesity; Prior pregnancy complicated by PIH, antepartum; Herpes; History of prior pregnancy with SGA newborn; Hypertension affecting pregnancy, antepartum; Bipolar I disorder, single manic episode (HCC); Clinical depression; Genital herpes; Avitaminosis D; and Supervision of high risk pregnancy, antepartum on her problem list.  Patient reports no complaints.  Contractions: Not present. Vag. Bleeding: None.  Movement: Present. Denies leaking of fluid.   The following portions of the patient's history were reviewed and updated as appropriate: allergies, current medications, past family history, past medical history, past social history, past surgical history and problem list. Problem list updated.  Objective:   Filed Vitals:   01/01/15 0756  BP: 117/73  Pulse: 95  Weight: 233 lb 9.6 oz (105.96 kg)    Fetal Status: Fetal Heart Rate (bpm): NST Fundal Height: 35 cm Movement: Present     General:  Alert, oriented and cooperative. Patient is in no acute distress.  Skin: Skin is warm and dry. No rash noted.   Cardiovascular: Normal heart rate noted  Respiratory: Normal respiratory effort, no problems with respiration noted  Abdomen: Soft, gravid, appropriate for gestational age. Pain/Pressure: Absent     Pelvic: Vag. Bleeding: None     Cervical exam deferred        Extremities: Normal range of motion.  Edema: None  Mental Status: Normal mood and affect. Normal behavior. Normal judgment and thought content.   Urinalysis: Urine Protein: 1+ (Simultaneous filing. User may not have seen previous data.) Urine Glucose: Negative (Simultaneous filing. User may not have seen previous data.) NST performed  today was reviewed and was found to be reactive.    Assessment and Plan:  Pregnancy: G3P2002 at 663w4d  1. Hypertension affecting pregnancy, antepartum, third trimester Continue recommended antenatal testing and prenatal care. Growth scan scheduled today.  Stable BP.  2. Genital herpes Suppression therapy prescribed - valACYclovir (VALTREX) 500 MG tablet; Take 1 tablet (500 mg total) by mouth 2 (two) times daily.  Dispense: 60 tablet; Refill: 6  3. Supervision of high risk pregnancy, antepartum, third trimester Preterm labor symptoms and general obstetric precautions including but not limited to vaginal bleeding, contractions, leaking of fluid and fetal movement were reviewed in detail with the patient. Please refer to After Visit Summary for other counseling recommendations.  Return in about 4 days (around 01/05/2015) for 2x/wk as scheduled.   Tereso NewcomerUgonna A Sativa Gelles, MD

## 2015-01-01 NOTE — Patient Instructions (Signed)
Return to clinic for any obstetric concerns or go to [facility] for evaluation  

## 2015-01-05 ENCOUNTER — Telehealth: Payer: Self-pay | Admitting: Family Medicine

## 2015-01-05 ENCOUNTER — Other Ambulatory Visit: Payer: Medicaid Other

## 2015-01-05 NOTE — Telephone Encounter (Signed)
Called about missed appointment, and reminded her of appointment on Thursday.

## 2015-01-08 ENCOUNTER — Ambulatory Visit (INDEPENDENT_AMBULATORY_CARE_PROVIDER_SITE_OTHER): Payer: Medicaid Other | Admitting: Obstetrics & Gynecology

## 2015-01-08 VITALS — BP 121/72 | HR 87 | Wt 231.1 lb

## 2015-01-08 DIAGNOSIS — Z36 Encounter for antenatal screening of mother: Secondary | ICD-10-CM

## 2015-01-08 DIAGNOSIS — O163 Unspecified maternal hypertension, third trimester: Secondary | ICD-10-CM | POA: Diagnosis present

## 2015-01-08 LAB — POCT URINALYSIS DIP (DEVICE)
Bilirubin Urine: NEGATIVE
Glucose, UA: NEGATIVE mg/dL
HGB URINE DIPSTICK: NEGATIVE
Ketones, ur: NEGATIVE mg/dL
Nitrite: NEGATIVE
Protein, ur: NEGATIVE mg/dL
SPECIFIC GRAVITY, URINE: 1.025 (ref 1.005–1.030)
UROBILINOGEN UA: 0.2 mg/dL (ref 0.0–1.0)
pH: 6.5 (ref 5.0–8.0)

## 2015-01-08 NOTE — Progress Notes (Signed)
Subjective:  Donna Thomas is a 29 y.o. [id number] at 5871w4d being seen today for ongoing prenatal care.  She is currently monitored for the following issues for this high-risk pregnancy and has Prior pregnancy complicated by SGA (small for gestational age), antepartum; ASCUS with positive high risk HPV; Obesity; Prior pregnancy complicated by PIH, antepartum; Herpes; History of prior pregnancy with SGA newborn; Hypertension affecting pregnancy, antepartum; Bipolar I disorder, single manic episode (HCC); Clinical depression; Genital herpes; Avitaminosis D; and Supervision of high risk pregnancy, antepartum on her problem list.  Patient reports no complaints.  Contractions: Irritability. Vag. Bleeding: None.  Movement: Present. Denies leaking of fluid.   The following portions of the patient's history were reviewed and updated as appropriate: allergies, current medications, past family history, past medical history, past social history, past surgical history and problem list. Problem list updated.  Objective:   Filed Vitals:   01/08/15 0803  BP: 121/72  Pulse: 87  Weight: 231 lb 1.6 oz (104.826 kg)    Fetal Status: Fetal Heart Rate (bpm): NST   Movement: Present     General:  Alert, oriented and cooperative. Patient is in no acute distress.  Skin: Skin is warm and dry. No rash noted.   Cardiovascular: Normal heart rate noted  Respiratory: Normal respiratory effort, no problems with respiration noted  Abdomen: Soft, gravid, appropriate for gestational age. Pain/Pressure: Absent     Pelvic: Vag. Bleeding: None     Cervical exam deferred        Extremities: Normal range of motion.     Mental Status: Normal mood and affect. Normal behavior. Normal judgment and thought content.   Urinalysis:      Assessment and Plan:  Pregnancy: G3P2002 at 6771w4d  1. Hypertension affecting pregnancy, antepartum, third trimester NST reactive today and nl growth 1 week ago - Amniotic fluid index with  NST  Preterm labor symptoms and general obstetric precautions including but not limited to vaginal bleeding, contractions, leaking of fluid and fetal movement were reviewed in detail with the patient. Please refer to After Visit Summary for other counseling recommendations.  Return in about 1 week (around 01/15/2015) for 2x/wk as scheduled.   Adam PhenixJames G Ashir Kunz, MD

## 2015-01-08 NOTE — Progress Notes (Signed)
Breastfeeding tip of the week reviewed. 

## 2015-01-08 NOTE — Patient Instructions (Signed)

## 2015-01-13 ENCOUNTER — Ambulatory Visit (INDEPENDENT_AMBULATORY_CARE_PROVIDER_SITE_OTHER): Payer: Medicaid Other | Admitting: *Deleted

## 2015-01-13 VITALS — BP 114/74 | HR 90

## 2015-01-13 DIAGNOSIS — O163 Unspecified maternal hypertension, third trimester: Secondary | ICD-10-CM

## 2015-01-15 ENCOUNTER — Encounter: Payer: Self-pay | Admitting: Family Medicine

## 2015-01-15 ENCOUNTER — Ambulatory Visit (INDEPENDENT_AMBULATORY_CARE_PROVIDER_SITE_OTHER): Payer: Medicaid Other | Admitting: Family Medicine

## 2015-01-15 ENCOUNTER — Other Ambulatory Visit (HOSPITAL_COMMUNITY)
Admission: RE | Admit: 2015-01-15 | Discharge: 2015-01-15 | Disposition: A | Discharge status: Home / Self Care | Payer: Medicaid Other | Source: Ambulatory Visit | Attending: Family Medicine | Admitting: Family Medicine

## 2015-01-15 VITALS — BP 116/77 | HR 94 | Wt 236.2 lb

## 2015-01-15 DIAGNOSIS — O99213 Obesity complicating pregnancy, third trimester: Secondary | ICD-10-CM | POA: Diagnosis not present

## 2015-01-15 DIAGNOSIS — A6000 Herpesviral infection of urogenital system, unspecified: Secondary | ICD-10-CM

## 2015-01-15 DIAGNOSIS — O0993 Supervision of high risk pregnancy, unspecified, third trimester: Secondary | ICD-10-CM

## 2015-01-15 DIAGNOSIS — E669 Obesity, unspecified: Secondary | ICD-10-CM

## 2015-01-15 DIAGNOSIS — Z36 Encounter for antenatal screening of mother: Secondary | ICD-10-CM | POA: Diagnosis not present

## 2015-01-15 DIAGNOSIS — F319 Bipolar disorder, unspecified: Secondary | ICD-10-CM | POA: Diagnosis not present

## 2015-01-15 DIAGNOSIS — O98313 Other infections with a predominantly sexual mode of transmission complicating pregnancy, third trimester: Secondary | ICD-10-CM

## 2015-01-15 DIAGNOSIS — Z113 Encounter for screening for infections with a predominantly sexual mode of transmission: Secondary | ICD-10-CM

## 2015-01-15 DIAGNOSIS — A6009 Herpesviral infection of other urogenital tract: Secondary | ICD-10-CM

## 2015-01-15 DIAGNOSIS — O163 Unspecified maternal hypertension, third trimester: Secondary | ICD-10-CM

## 2015-01-15 DIAGNOSIS — O99343 Other mental disorders complicating pregnancy, third trimester: Secondary | ICD-10-CM | POA: Diagnosis not present

## 2015-01-15 DIAGNOSIS — O10913 Unspecified pre-existing hypertension complicating pregnancy, third trimester: Secondary | ICD-10-CM | POA: Diagnosis not present

## 2015-01-15 LAB — POCT URINALYSIS DIP (DEVICE)
BILIRUBIN URINE: NEGATIVE
Glucose, UA: NEGATIVE mg/dL
Hgb urine dipstick: NEGATIVE
Ketones, ur: NEGATIVE mg/dL
NITRITE: NEGATIVE
PH: 7 (ref 5.0–8.0)
PROTEIN: NEGATIVE mg/dL
Specific Gravity, Urine: 1.015 (ref 1.005–1.030)
UROBILINOGEN UA: 0.2 mg/dL (ref 0.0–1.0)

## 2015-01-15 NOTE — Progress Notes (Signed)
  Subjective:  Donna Thomas is a 29 y.o. Z6X0960G3P2002 at 7270w4d being seen today for ongoing prenatal care.  She is currently monitored for the following issues for this high-risk pregnancy and has Prior pregnancy complicated by SGA (small for gestational age), antepartum; ASCUS with positive high risk HPV; Obesity; Prior pregnancy complicated by PIH, antepartum; Herpes; History of prior pregnancy with SGA newborn; Hypertension affecting pregnancy, antepartum; Bipolar I disorder, single manic episode (HCC); Clinical depression; Genital herpes; Avitaminosis D; and Supervision of high risk pregnancy, antepartum on her problem list.  Patient reports no complaints.  Contractions: Not present. Vag. Bleeding: None.  Movement: Present. Denies leaking of fluid.   The following portions of the patient's history were reviewed and updated as appropriate: allergies, current medications, past family history, past medical history, past social history, past surgical history and problem list. Problem list updated.  Objective:   Filed Vitals:   01/15/15 0807  BP: 116/77  Pulse: 94  Weight: 236 lb 3.2 oz (107.14 kg)    Fetal Status: Fetal Heart Rate (bpm): NST   Movement: Present  Presentation: Vertex Fundal Height 36  General:  Alert, oriented and cooperative. Patient is in no acute distress.  Skin: Skin is warm and dry. No rash noted.   Cardiovascular: Normal heart rate noted  Respiratory: Normal respiratory effort, no problems with respiration noted  Abdomen: Soft, gravid, appropriate for gestational age. Pain/Pressure: Present     Pelvic: Vag. Bleeding: None     Cervical exam performed Dilation: 3.5 Effacement (%): 80 Station: -1  Extremities: Normal range of motion.  Edema: None  Mental Status: Normal mood and affect. Normal behavior. Normal judgment and thought content.   Urinalysis:    Prot neg, gluc neg NST reviewed and reactive. U/S 12/15 - vtx, AFI 9.25, EFW 4 lb 12 oz, 35% Assessment and Plan:   Pregnancy: G3P2002 at 6070w4d  1. Hypertension affecting pregnancy, antepartum, third trimester BP is well controlled - Amniotic fluid index with NST  2. Supervision of high risk pregnancy, antepartum, third trimester Cultures today - Culture, beta strep (group b only) - GC[facility]Chlamydia probe amp [facility]  3. Genital herpes On Valtrex  Term labor symptoms and general obstetric precautions including but not limited to vaginal bleeding, contractions, leaking of fluid and fetal movement were reviewed in detail with the patient. Please refer to After Visit Summary for other counseling recommendations.  Return in about 4 days (around 01/19/2015) for 2x/wk as scheduled.   Reva Boresanya S Trelyn Vanderlinde, MD

## 2015-01-16 ENCOUNTER — Encounter: Payer: Self-pay | Admitting: Obstetrics & Gynecology

## 2015-01-16 NOTE — Progress Notes (Signed)
NST reactive and reassuring on 01/13/15

## 2015-01-17 LAB — GC/CHLAMYDIA PROBE AMP (~~LOC~~) NOT AT ARMC
Chlamydia: NEGATIVE
Neisseria Gonorrhea: NEGATIVE

## 2015-01-17 LAB — CULTURE, BETA STREP (GROUP B ONLY)

## 2015-01-18 NOTE — L&D Delivery Note (Signed)
[facility] viable [facility] at [facility] @ approx 2245.  EMS delivered placenta shortly afterwards.  Upon arrival to [facility]U, mother  Holding inf girl in arms, bleeding sm.  Denies cramping at this point

## 2015-01-19 ENCOUNTER — Ambulatory Visit (INDEPENDENT_AMBULATORY_CARE_PROVIDER_SITE_OTHER): Payer: Medicaid Other | Admitting: *Deleted

## 2015-01-19 VITALS — BP 110/67 | HR 87

## 2015-01-19 DIAGNOSIS — O163 Unspecified maternal hypertension, third trimester: Secondary | ICD-10-CM

## 2015-01-19 NOTE — Progress Notes (Signed)
NST reactive.

## 2015-01-22 ENCOUNTER — Ambulatory Visit (INDEPENDENT_AMBULATORY_CARE_PROVIDER_SITE_OTHER): Payer: Medicaid Other | Admitting: Advanced Practice Midwife

## 2015-01-22 VITALS — BP 113/75 | HR 90 | Wt 235.3 lb

## 2015-01-22 DIAGNOSIS — E669 Obesity, unspecified: Secondary | ICD-10-CM | POA: Diagnosis not present

## 2015-01-22 DIAGNOSIS — O0993 Supervision of high risk pregnancy, unspecified, third trimester: Secondary | ICD-10-CM

## 2015-01-22 DIAGNOSIS — F329 Major depressive disorder, single episode, unspecified: Secondary | ICD-10-CM

## 2015-01-22 DIAGNOSIS — O98313 Other infections with a predominantly sexual mode of transmission complicating pregnancy, third trimester: Secondary | ICD-10-CM

## 2015-01-22 DIAGNOSIS — Z8759 Personal history of other complications of pregnancy, childbirth and the puerperium: Secondary | ICD-10-CM

## 2015-01-22 DIAGNOSIS — O99213 Obesity complicating pregnancy, third trimester: Secondary | ICD-10-CM | POA: Diagnosis not present

## 2015-01-22 DIAGNOSIS — O10913 Unspecified pre-existing hypertension complicating pregnancy, third trimester: Secondary | ICD-10-CM

## 2015-01-22 DIAGNOSIS — O163 Unspecified maternal hypertension, third trimester: Secondary | ICD-10-CM

## 2015-01-22 DIAGNOSIS — B009 Herpesviral infection, unspecified: Secondary | ICD-10-CM

## 2015-01-22 DIAGNOSIS — Z36 Encounter for antenatal screening of mother: Secondary | ICD-10-CM

## 2015-01-22 DIAGNOSIS — O99343 Other mental disorders complicating pregnancy, third trimester: Secondary | ICD-10-CM

## 2015-01-22 LAB — POCT URINALYSIS DIP (DEVICE)
BILIRUBIN URINE: NEGATIVE
GLUCOSE, UA: NEGATIVE mg/dL
KETONES UR: NEGATIVE mg/dL
Nitrite: NEGATIVE
Protein, ur: NEGATIVE mg/dL
Specific Gravity, Urine: 1.02 (ref 1.005–1.030)
Urobilinogen, UA: 0.2 mg/dL (ref 0.0–1.0)
pH: 7 (ref 5.0–8.0)

## 2015-01-22 NOTE — Addendum Note (Signed)
Addended by: Jill SideAY, Kenyetta Wimbish L on: 01/22/2015 05:13 PM   Modules accepted: Orders

## 2015-01-22 NOTE — Progress Notes (Signed)
Subjective:  Donna Thomas is a 30 y.o. Z6X0960G3P2002 at 3541w4d being seen today for ongoing prenatal care.  She is currently monitored for the following issues for this high-risk pregnancy and has Prior pregnancy complicated by SGA (small for gestational age), antepartum; ASCUS with positive high risk HPV; Obesity; Prior pregnancy complicated by PIH, antepartum; Herpes; History of prior pregnancy with SGA newborn; Hypertension affecting pregnancy, antepartum; Bipolar I disorder, single manic episode (HCC); Clinical depression; Genital herpes; Avitaminosis D; and Supervision of high risk pregnancy, antepartum on her problem list.  Patient reports no complaints.  Contractions: Irregular. Vag. Bleeding: None.  Movement: Present. Denies leaking of fluid.   The following portions of the patient's history were reviewed and updated as appropriate: allergies, current medications, past family history, past medical history, past social history, past surgical history and problem list. Problem list updated.  Objective:   Filed Vitals:   01/22/15 1104  BP: 113/75  Pulse: 90  Weight: 235 lb 4.8 oz (106.731 kg)    Fetal Status: Fetal Heart Rate (bpm): NST reactive  Fundal Height: 37 cm Movement: Present  Presentation: Vertex  General:  Alert, oriented and cooperative. Patient is in no acute distress.  Skin: Skin is warm and dry. No rash noted.   Cardiovascular: Normal heart rate noted  Respiratory: Normal respiratory effort, no problems with respiration noted  Abdomen: Soft, gravid, appropriate for gestational age. Pain/Pressure: Present     Pelvic: Vag. Bleeding: None     Cervical exam deferred        Extremities: Normal range of motion.  Edema: None  Mental Status: Normal mood and affect. Normal behavior. Normal judgment and thought content.   Urinalysis: Urine Protein: Negative Urine Glucose: Negative  Assessment and Plan:  Pregnancy: G3P2002 at 7441w4d  1. Hypertension affecting pregnancy,  antepartum, third trimester - Continue twice weekly testing - Plan IOL at 39 weeks (~1/15)  Term labor symptoms and general obstetric precautions including but not limited to vaginal bleeding, contractions, leaking of fluid and fetal movement were reviewed in detail with the patient. Please refer to After Visit Summary for other counseling recommendations.  Pre-E precautions. Return for Continue twice weekly testing.   Donna Thomas, CNM

## 2015-01-26 ENCOUNTER — Ambulatory Visit (INDEPENDENT_AMBULATORY_CARE_PROVIDER_SITE_OTHER): Payer: Medicaid Other | Admitting: *Deleted

## 2015-01-26 VITALS — BP 115/72 | HR 75

## 2015-01-26 DIAGNOSIS — O163 Unspecified maternal hypertension, third trimester: Secondary | ICD-10-CM | POA: Diagnosis present

## 2015-01-26 NOTE — Progress Notes (Signed)
IOL scheduled 02/08/15 @ 0700

## 2015-01-26 NOTE — Progress Notes (Signed)
NST reactive.

## 2015-01-29 ENCOUNTER — Ambulatory Visit (INDEPENDENT_AMBULATORY_CARE_PROVIDER_SITE_OTHER): Payer: Medicaid Other | Admitting: Family Medicine

## 2015-01-29 VITALS — BP 115/72 | HR 78 | Wt 228.3 lb

## 2015-01-29 DIAGNOSIS — O09293 Supervision of pregnancy with other poor reproductive or obstetric history, third trimester: Secondary | ICD-10-CM

## 2015-01-29 DIAGNOSIS — O163 Unspecified maternal hypertension, third trimester: Secondary | ICD-10-CM

## 2015-01-29 DIAGNOSIS — O0993 Supervision of high risk pregnancy, unspecified, third trimester: Secondary | ICD-10-CM

## 2015-01-29 DIAGNOSIS — O09292 Supervision of pregnancy with other poor reproductive or obstetric history, second trimester: Secondary | ICD-10-CM

## 2015-01-29 DIAGNOSIS — Z36 Encounter for antenatal screening of mother: Secondary | ICD-10-CM

## 2015-01-29 LAB — POCT URINALYSIS DIP (DEVICE)
Glucose, UA: NEGATIVE mg/dL
KETONES UR: 15 mg/dL — AB
NITRITE: NEGATIVE
PH: 5.5 (ref 5.0–8.0)
Protein, ur: NEGATIVE mg/dL
SPECIFIC GRAVITY, URINE: 1.025 (ref 1.005–1.030)
Urobilinogen, UA: 1 mg/dL (ref 0.0–1.0)

## 2015-01-29 NOTE — Progress Notes (Signed)
IOL scheduled 1/22.  Breastfeeding tip of the week reviewed.  Pt desires Cx exam today due to increased UC frequency and intensity.

## 2015-01-29 NOTE — Progress Notes (Signed)
Subjective:  Donna Thomas is a 30 y.o. O9G2952G3P2002 at 4231w4d being seen today for ongoing prenatal care.  She is currently monitored for the following issues for this high-risk pregnancy and has Prior pregnancy complicated by SGA (small for gestational age), antepartum; ASCUS with positive high risk HPV; Obesity; Prior pregnancy complicated by PIH, antepartum; Herpes; History of prior pregnancy with SGA newborn; Hypertension affecting pregnancy, antepartum; Bipolar I disorder, single manic episode (HCC); Clinical depression; Genital herpes; Avitaminosis D; and Supervision of high risk pregnancy, antepartum on her problem list.  Patient reports no complaints.  Contractions: Irregular. Vag. Bleeding: None.  Movement: Present. Denies leaking of fluid.   The following portions of the patient's history were reviewed and updated as appropriate: allergies, current medications, past family history, past medical history, past social history, past surgical history and problem list. Problem list updated.  Objective:   Filed Vitals:   01/29/15 1022  BP: 115/72  Pulse: 78  Weight: 228 lb 4.8 oz (103.556 kg)    Fetal Status: Fetal Heart Rate (bpm): NST   Movement: Present  Presentation: Vertex  General:  Alert, oriented and cooperative. Patient is in no acute distress.  Skin: Skin is warm and dry. No rash noted.   Cardiovascular: Normal heart rate noted  Respiratory: Normal respiratory effort, no problems with respiration noted  Abdomen: Soft, gravid, appropriate for gestational age. Pain/Pressure: Present     Pelvic: Vag. Bleeding: None     Cervical exam deferred Dilation: 4.5 Effacement (%): 90 Station: -2  Extremities: Normal range of motion.  Edema: None  Mental Status: Normal mood and affect. Normal behavior. Normal judgment and thought content.   Urinalysis: Urine Protein: Negative Urine Glucose: Negative  Assessment and Plan:  Pregnancy: G3P2002 at 4931w4d  1. Hypertension affecting pregnancy,  antepartum, third trimester - NST reactive - Amniotic fluid index with NST  2. Supervision of high risk pregnancy, antepartum, third trimester - dilated cervix today, irregular contractions - IOl scheduled for 1/22 - Discussed labor precautions.   3. Prior pregnancy complicated by SGA (small for gestational age), antepartum, second trimester   Term labor symptoms and general obstetric precautions including but not limited to vaginal bleeding, contractions, leaking of fluid and fetal movement were reviewed in detail with the patient. Please refer to After Visit Summary for other counseling recommendations.  Return in about 4 days (around 02/02/2015) for as scheduled.   Federico FlakeKimberly Niles Newton, MD

## 2015-02-02 ENCOUNTER — Ambulatory Visit (INDEPENDENT_AMBULATORY_CARE_PROVIDER_SITE_OTHER): Payer: Medicaid Other | Admitting: *Deleted

## 2015-02-02 ENCOUNTER — Other Ambulatory Visit: Payer: Medicaid Other

## 2015-02-02 ENCOUNTER — Inpatient Hospital Stay (HOSPITAL_COMMUNITY)
Admission: AD | Admit: 2015-02-02 | Discharge: 2015-02-04 | DRG: 776 | Disposition: A | Discharge status: Home / Self Care | Payer: Medicaid Other | Source: Ambulatory Visit | Attending: Family Medicine | Admitting: Family Medicine

## 2015-02-02 VITALS — BP 131/85 | HR 83

## 2015-02-02 DIAGNOSIS — Z3A39 39 weeks gestation of pregnancy: Secondary | ICD-10-CM

## 2015-02-02 DIAGNOSIS — O99345 Other mental disorders complicating the puerperium: Secondary | ICD-10-CM | POA: Diagnosis present

## 2015-02-02 DIAGNOSIS — O1003 Pre-existing essential hypertension complicating the puerperium: Secondary | ICD-10-CM | POA: Diagnosis present

## 2015-02-02 DIAGNOSIS — A6000 Herpesviral infection of urogenital system, unspecified: Secondary | ICD-10-CM

## 2015-02-02 DIAGNOSIS — O163 Unspecified maternal hypertension, third trimester: Secondary | ICD-10-CM

## 2015-02-02 DIAGNOSIS — O9832 Other infections with a predominantly sexual mode of transmission complicating childbirth: Secondary | ICD-10-CM

## 2015-02-02 DIAGNOSIS — Z7982 Long term (current) use of aspirin: Secondary | ICD-10-CM

## 2015-02-02 DIAGNOSIS — F319 Bipolar disorder, unspecified: Secondary | ICD-10-CM | POA: Diagnosis present

## 2015-02-02 DIAGNOSIS — Z833 Family history of diabetes mellitus: Secondary | ICD-10-CM

## 2015-02-02 DIAGNOSIS — O1092 Unspecified pre-existing hypertension complicating childbirth: Secondary | ICD-10-CM

## 2015-02-02 DIAGNOSIS — O99344 Other mental disorders complicating childbirth: Secondary | ICD-10-CM

## 2015-02-02 DIAGNOSIS — O9833 Other infections with a predominantly sexual mode of transmission complicating the puerperium: Secondary | ICD-10-CM | POA: Diagnosis present

## 2015-02-02 HISTORY — DX: Personal history of other infectious and parasitic diseases: Z86.19

## 2015-02-02 HISTORY — DX: Personal history of other diseases of the female genital tract: Z87.42

## 2015-02-02 HISTORY — DX: Herpesviral infection, unspecified: B00.9

## 2015-02-02 HISTORY — DX: Manic episode, unspecified: F30.9

## 2015-02-02 HISTORY — DX: Personal history of other mental and behavioral disorders: Z86.59

## 2015-02-02 MED ORDER — OXYTOCIN 10 UNIT/ML IJ SOLN
10.0000 [IU] | Freq: Once | INTRAMUSCULAR | Status: AC
  Administered 2015-02-03: 10 [IU] via INTRAMUSCULAR

## 2015-02-02 NOTE — Progress Notes (Signed)
Pt denies H/A or visual disturbances.  

## 2015-02-02 NOTE — H&P (Signed)
OBSTETRIC ADMISSION HISTORY AND PHYSICAL  Donna Thomas is a 30 y.o. female 143P2002 with IUP at 2753w1d by 17 presenting for Delivery at home. Reports strong contractions started at 10 PM and she felt the strong urge to push. She reports she delivered her baby at her mom's house. The placenta delivered easily afterwards. She arrived to MAU via EMS.  She plans on bottle feeding. She request ppBTL for birth control.  Dating: By 6317 --->  Estimated Date of Delivery: 02/08/15  [facility]  Dating By 17 weeks US Blood type:   A+  Genetic Screen  Quad: neg Antibody:Negative (08/08 0000)  Anatomic US nml  Rubella:  immune  GTT Early: 85              Third trimester: 94 RPR:   negative  Flu vaccine 10/13/14 HBsAg:   nonreactive  TDaP vaccine 11/17/14                   HIV: Non-reactive (08/08 0000)   Baby Food Breast!!:)                                              GBS: Neg  Contraception BTL, papers signed 10/13/14 [facility] - ASCUS + HPV  Circumcision female   Pediatrician [facility]   Support Person Mom Venita Sheffield(Gladys Strange)     Prenatal History/Complications:  Past Medical History: Past Medical History  Diagnosis Date  . Infection   . Headache(784.0)   . Complication of anesthesia   . Hypertension   . Pregnancy induced hypertension     Past Surgical History: No past surgical history on file.  Obstetrical History: OB History    Gravida Para Term Preterm AB TAB SAB Ectopic Multiple Living   3 2 2       2       Social History: Social History   Social History  . Marital Status: Single    Spouse Name: N/A  . Number of Children: N/A  . Years of Education: N/A   Social History Main Topics  . Smoking status: Never Smoker   . Smokeless tobacco: Never Used  . Alcohol Use: No  . Drug Use: No  . Sexual Activity: Not Currently    Birth Control/ Protection: None   Other Topics Concern  . Not on file   Social History Narrative    Family  History: Family History  Problem Relation Age of Onset  . Diabetes Maternal Grandmother   . Cancer Maternal Grandmother     Allergies: No Known Allergies  Prescriptions prior to admission  Medication Sig Dispense Refill Last Dose  . aspirin EC 81 MG tablet Take 1 tablet (81 mg total) by mouth daily. 100 tablet 2 02/01/2015 at Unknown time  . Prenatal MV-Min-Fe Fum-FA-DHA ([employer] PRENATAL MULTI+DHA) 27-0.8-250 MG CAPS Take 1 capsule by mouth.   02/01/2015 at Unknown time  . valACYclovir (VALTREX) 500 MG tablet Take 1 tablet (500 mg total) by mouth 2 (two) times daily. 60 tablet 6 02/01/2015 at Unknown time  . Vitamin D, Ergocalciferol, (DRISDOL) 50000 UNITS CAPS capsule Take 1 capsule (50,000 Units total) by mouth every 7 (seven) days. 4 capsule 2 02/01/2015 at Unknown time    Review of Systems   All systems reviewed and negative except as stated in HPI  Last  menstrual period 03/29/2014. General appearance: alert, cooperative and appears stated age Lungs: clear to auscultation bilaterally Heart: regular rate and rhythm Abdomen: soft, non-tender; bowel sounds normal Pelvic: small abrasions at introitus Extremities: Homans sign is negative, no sign of DVT   Prenatal labs: ABO, Rh:  A POS Antibody: Negative (08/08 0000) Rubella: Immune RPR: NON REAC (10/31 1107)  HBsAg: NEGATIVE (10/31 1107)  HIV: NONREACTIVE (10/31 1107)  GBS:   Neg 1 hr Glucola 94 Genetic screening  wnl  Anatomy US wnl  Prenatal Transfer Tool  Maternal Diabetes: No Genetic Screening: Normal Maternal Ultrasounds/Referrals: Normal Fetal Ultrasounds or other Referrals:  None Maternal Substance Abuse:  No Significant Maternal Medications:  None Significant Maternal Lab Results: Lab values include: Group B Strep negative, Other: HSV pos, on prophylaxis.   No results found for this or any previous visit (from the past 24 hour(s)).  Patient Active Problem List   Diagnosis Date Noted  . Supervision of high risk  pregnancy, antepartum 11/17/2014  . Hypertension affecting pregnancy, antepartum 09/29/2014  . Obesity 09/01/2014  . Prior pregnancy complicated by [facility], antepartum 09/01/2014  . Herpes 09/01/2014  . History of prior pregnancy with SGA newborn 09/01/2014  . ASCUS with positive high risk HPV 05/31/2012  . Prior pregnancy complicated by SGA (small for gestational age), antepartum 05/22/2012  . Clinical depression 04/26/2011  . Avitaminosis D 12/02/2010  . Bipolar I disorder, single manic episode (HCC) 04/26/2010  . Genital herpes 04/26/2010    Assessment: Donna Thomas is a 30 y.o. Z6X0960 at [redacted]w[redacted]d here after unplanned home delivery  -Admit to postpartum -Pitocin 10mg  IM in MAU, recieved -Routine PP care -plan for btl in AM, will post case ASAP -bottlefeeding  Donna Thomas 02/02/2015, 11:52 PM

## 2015-02-02 NOTE — Progress Notes (Signed)
NST reactive.

## 2015-02-03 ENCOUNTER — Encounter (HOSPITAL_COMMUNITY): Payer: Self-pay | Admitting: *Deleted

## 2015-02-03 DIAGNOSIS — O99344 Other mental disorders complicating childbirth: Secondary | ICD-10-CM | POA: Diagnosis present

## 2015-02-03 DIAGNOSIS — Z809 Family history of malignant neoplasm, unspecified: Secondary | ICD-10-CM | POA: Diagnosis not present

## 2015-02-03 DIAGNOSIS — Z7982 Long term (current) use of aspirin: Secondary | ICD-10-CM | POA: Diagnosis not present

## 2015-02-03 DIAGNOSIS — Z3A39 39 weeks gestation of pregnancy: Secondary | ICD-10-CM | POA: Diagnosis not present

## 2015-02-03 DIAGNOSIS — O9832 Other infections with a predominantly sexual mode of transmission complicating childbirth: Secondary | ICD-10-CM | POA: Diagnosis present

## 2015-02-03 DIAGNOSIS — O1092 Unspecified pre-existing hypertension complicating childbirth: Secondary | ICD-10-CM | POA: Diagnosis present

## 2015-02-03 DIAGNOSIS — A6000 Herpesviral infection of urogenital system, unspecified: Secondary | ICD-10-CM | POA: Diagnosis present

## 2015-02-03 DIAGNOSIS — Z833 Family history of diabetes mellitus: Secondary | ICD-10-CM | POA: Diagnosis not present

## 2015-02-03 LAB — CBC
HEMATOCRIT: 33.7 % — AB (ref 36.0–46.0)
Hemoglobin: 11.2 g/dL — ABNORMAL LOW (ref 12.0–15.0)
MCH: 28.1 pg (ref 26.0–34.0)
MCHC: 33.2 g/dL (ref 30.0–36.0)
MCV: 84.5 fL (ref 78.0–100.0)
Platelets: 251 10*3/uL (ref 150–400)
RBC: 3.99 MIL/uL (ref 3.87–5.11)
RDW: 14.9 % (ref 11.5–15.5)
WBC: 8.4 10*3/uL (ref 4.0–10.5)

## 2015-02-03 LAB — TYPE AND SCREEN
ABO/RH(D): A POS
ANTIBODY SCREEN: NEGATIVE

## 2015-02-03 MED ORDER — ONDANSETRON HCL 4 MG PO TABS: 4.0000 mg | ORAL_TABLET | ORAL | Status: DC | PRN

## 2015-02-03 MED ORDER — DIBUCAINE 1 % RE OINT: 1.0000 "application " | TOPICAL_OINTMENT | RECTAL | Status: DC | PRN

## 2015-02-03 MED ORDER — ONDANSETRON HCL 4 MG/2ML IJ SOLN
4.0000 mg | INTRAMUSCULAR | Status: DC | PRN
Start: 2015-02-03 — End: 2015-02-04

## 2015-02-03 MED ORDER — OXYTOCIN 10 UNIT/ML IJ SOLN
INTRAMUSCULAR | Status: AC
  Filled 2015-02-03: qty 1

## 2015-02-03 MED ORDER — OXYCODONE-ACETAMINOPHEN 5-325 MG PO TABS: 2.0000 | ORAL_TABLET | ORAL | Status: DC | PRN

## 2015-02-03 MED ORDER — DIPHENHYDRAMINE HCL 25 MG PO CAPS: 25.0000 mg | ORAL_CAPSULE | Freq: Four times a day (QID) | ORAL | Status: DC | PRN

## 2015-02-03 MED ORDER — BENZOCAINE-MENTHOL 20-0.5 % EX AERO: 1.0000 "application " | INHALATION_SPRAY | CUTANEOUS | Status: DC | PRN

## 2015-02-03 MED ORDER — LANOLIN HYDROUS EX OINT: TOPICAL_OINTMENT | CUTANEOUS | Status: DC | PRN

## 2015-02-03 MED ORDER — WITCH HAZEL-GLYCERIN EX PADS: 1.0000 "application " | MEDICATED_PAD | CUTANEOUS | Status: DC | PRN

## 2015-02-03 MED ORDER — ACETAMINOPHEN 325 MG PO TABS: 650.0000 mg | ORAL_TABLET | ORAL | Status: DC | PRN

## 2015-02-03 MED ORDER — OXYCODONE-ACETAMINOPHEN 5-325 MG PO TABS
1.0000 | ORAL_TABLET | ORAL | Status: DC | PRN
  Administered 2015-02-03: 1 via ORAL
  Filled 2015-02-03: qty 1

## 2015-02-03 MED ORDER — IBUPROFEN 600 MG PO TABS
600.0000 mg | ORAL_TABLET | Freq: Four times a day (QID) | ORAL | Status: DC
  Administered 2015-02-03 – 2015-02-04 (×6): 600 mg via ORAL
  Filled 2015-02-03 (×7): qty 1

## 2015-02-03 MED ORDER — DOCUSATE SODIUM 100 MG PO CAPS
100.0000 mg | ORAL_CAPSULE | Freq: Two times a day (BID) | ORAL | Status: DC
  Administered 2015-02-03 – 2015-02-04 (×3): 100 mg via ORAL
  Filled 2015-02-03 (×3): qty 1

## 2015-02-03 MED ORDER — SIMETHICONE 80 MG PO CHEW: 80.0000 mg | CHEWABLE_TABLET | ORAL | Status: DC | PRN

## 2015-02-03 MED ORDER — PRENATAL MULTIVITAMIN CH
1.0000 | ORAL_TABLET | Freq: Every day | ORAL | Status: DC
  Administered 2015-02-03 – 2015-02-04 (×2): 1 via ORAL
  Filled 2015-02-03 (×2): qty 1

## 2015-02-03 NOTE — Progress Notes (Signed)
POSTPARTUM PROGRESS NOTE  Post Partum Day 1 Subjective:  Donna Thomas is a 30 y.o. Z6X0960 [redacted]w[redacted]d s/p nsvd at home.  No acute events overnight.  Pt denies problems with ambulating, voiding or po intake.  She denies nausea or vomiting.  Pain is well controlled.  She has had flatus. She has not had bowel movement.  Lochia Small.   Objective: Blood pressure 124/73, pulse 82, temperature 98.2 F (36.8 C), temperature source Oral, resp. rate 18, last menstrual period 03/29/2014, SpO2 100 %, unknown if currently breastfeeding.  Physical Exam:  General: alert, cooperative and no distress Lochia:normal flow Chest: CTAB Heart: RRR no m/r/g Abdomen: +BS, soft, nontender,  Uterine Fundus: firm,  DVT Evaluation: No calf swelling or tenderness Extremities: trace edema  No results for input(s): HGB, HCT in the last 72 hours.  Assessment/Plan:  ASSESSMENT: Donna Thomas is a 30 y.o. A5W0981 [redacted]w[redacted]d s/p nsvd, doing well  Plan for discharge tomorrow   LOS: 1 day   Silvano Bilis 02/03/2015, 1:05 PM

## 2015-02-03 NOTE — Progress Notes (Signed)
Post Partum Day 1 Subjective: Presented after home delivery. no complaints, up ad lib, voiding, tolerating PO and + flatus    Objective: Blood pressure 124/73, pulse 82, temperature 98.2 F (36.8 C), temperature source Oral, resp. rate 18, last menstrual period 03/29/2014, SpO2 100 %, unknown if currently breastfeeding.  Physical Exam:  General: alert, cooperative, appears stated age and no distress Lochia: appropriate Uterine Fundus: firm DVT Evaluation: No evidence of DVT seen on physical exam. No cords or calf tenderness.  No results for input(s): HGB, HCT in the last 72 hours.  Assessment/Plan: Patient is bottle feeding only.   Previous plans for BTL, now thinking she wants Nexplanon.  Has only had Sprite to drink this AM.   LOS: 1 day   Loyce Dys 02/03/2015, 8:00 AM

## 2015-02-03 NOTE — Progress Notes (Signed)
CLINICAL SOCIAL WORK MATERNAL/CHILD NOTE  Patient Details  Name: Donna Thomas MRN: [id number] Date of Birth: 02/02/2016  Date:  02/03/2015  Clinical Social Worker Initiating Note:  Elissa Hefty, MSW intern  Date/ Time Initiated:  02/03/15/1410     Child's Name:  Donna Thomas    Legal Guardian: Donna Thomas    Need for Interpreter:  None   Date of Referral:  02/02/15     Reason for Referral:  Behavioral Health Issues, including SI    Referral Source:  [facility]   Address:  [place], 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 MOB shared she used to see a psychiatrist and therapist at [facility] were she was prescribed Lexapro and Wellbutrin along with individual therapy and mother and daughter therapy. MOB reported she stopped her medication on her own about a year ago because she did not want to depend on medications for her mental health. MOB shared she has been fine during the pregnancy but does feel it would be beneficial and proactive for her to restart on her medications. MOB shared her mother has discussed it with her because she says she hasn't been the same since she stopped her medications. MOB stated she does get easily irritated at times but has never acted violently. MOB will call [facility] on [address] to schedule an appointment and MSW intern will check in on  MOB tomorrow.    Cognitive State:  Able to Concentrate , Goal Oriented , Insightful , Linear Thinking    Mood/Affect:  Happy , Interested , Bright , Calm , Comfortable    CSW Assessment:  MSW intern presented in patient's room due to a consult being placed by the central nursery because of a history of bipolar. MOB provided verbal consent for MSW intern to engage. MOB presented to be in a happy mood as evidence by her cheerful tone of voice and active engagement during the assessment. MOB shared her birthing process as being an eventful one, since she delivered the infant at her mother's house and then was brought to the hospital in an ambulance. MOB expressed that the birthing process quick and non-painful for her and she was transitioning well into postpartum. MOB shared she is currently bottle feeding only but may be interested in breastfeeding but is not sure yet. Per MOB, she lives with her two other children ages 25 and 2. MOB shared that her mother has a daycare and that her youngest child goes to her daycare and plans for the infant to go there as well once she starts work again in 25 weeks. MOB reported FOB is in [place] , she share that they get along well but he is currently not here and isn't aware that the infant has been born. MOB shared she does not plan to put him on the birth certificate either. MOB  did not go into too much detail about their relationship but did reassure MSW intern that there isn't any concerns in their relationship. MOB shared she is prepared to go home with the infant and has met all of the infant's basic needs. Per MOB, she has a great support system from her mother, father, and siblings.   MSW intern inquired about MOB's mental health during the pregnancy. MOB denied any concerns about her mental health during the pregnancy. MSW intern provided education on perinatal mood disorders and the hospital's support group, Feelings After Birth. MSW intern also  inquired about MOB's history of bipolar. MOB shared that she had been prescribed Lexapro and Wellbutrin by her psychiatrist at [facility] off [address] shortly after her second child was born in 2014. MOB also stated that she attended therapy for about 6 months; she started off with 2-3 sessions a week and then slowly got reduced to 1-2 sessions a week. MOB also shared she had a few therapy sessions with her mother during that 6 month period. MOB reported that she decided to discontinue her medications about a year ago because she didn't want to depend on medications for her mental health. MOB expressed she did go through some withdrawal periods but nothing that she couldn't handle. MOB did not voice any concerns about her mental health during her pregnancy but did share that her mother has advised her to restart her medications because she "hasn't been herself" since she stopped taking them. MOB also expressed that her mother can be overbearing at times and cause some of her stress. However, MOB voiced interest in restarting her medications to simply be proactive and take care of her mental health postpartum. MOB shared a few instances with MSW intern were she got irritated and felt upset with others. MOB denied ever feeling violent but did share that there were days when she was "moody" and didn't want to interact with other people. MSW intern inquired about the possibility of her scheduling an appointment with [facility] again.  MOB shared she loved the care she received there along with the therapy skills she learned. MOB stated she will call them and make an appointment with their psychiatrist in the next 24 hours. MSW intern asked MOB what therapy skills she acquired and found beneficial to help her in the mean time. Per MOB, she was given homework assignments by her therapist in which she learned how to change her negative thoughts and feelings. MSW intern went over CBT skills with MOB that she can  use in the meantime and she has found beneficial in the past.   MOB was very appreciative about MSW interns help and education provided. MOB requested for MSW intern to come back tomorrow and check in with her. MOB denied having any further questions or concerns at the moment but agreed to contact [facility] to schedule an appointment and contact MSW intern if needs arise.   CSW Plan/Description:  Engineer, mining- MSW intern provided education on perinatal mood disorders and the hospital's support group.  Other- MSW intern to follow up with MOB tomorrow about her appointment she is to schedule with [facility] and her next plans in regard to her mental health.  No Further Intervention Required/No Barriers to Discharge    Trevor Iha, Student-SW 02/03/2015, 2:50 PM

## 2015-02-04 LAB — RPR: RPR: NONREACTIVE

## 2015-02-04 MED ORDER — IBUPROFEN 600 MG PO TABS: 600.0000 mg | ORAL_TABLET | Freq: Four times a day (QID) | ORAL | Status: DC

## 2015-02-04 NOTE — Discharge Summary (Signed)
OB Discharge Summary     Patient Name: Donna Thomas DOB: Apr 14, 1985 MRN: [id number]  Date of admission: 02/02/2015 Delivering MD:     Date of discharge: 02/04/2015  Admitting diagnosis: 40 WEEKS DELIVERED AT HOME Intrauterine pregnancy: [redacted]w[redacted]d     Secondary diagnosis:  Principal Problem:   NSVD (normal spontaneous vaginal delivery) Active Problems:   Precipitous delivery, delivered (current hospitalization) Chronic hypertension HSV  Additional problems: none     Discharge diagnosis: Term Pregnancy Delivered                                                                                                Post partum procedures:none  Augmentation: none  Complications: None  Hospital course:  Onset of Labor With Vaginal Delivery     30 y.o. yo [id number] at [redacted]w[redacted]d was admitted s/p home delivery at 0000 on 02/02/2015. Patient had an uncomplicated hospital course.   Patient had a delivery of a Viable infant. 02/02/2015  Information for the patient's newborn:  Donna Thomas, Girl Donna Thomas [[id number]]  Delivery Method: Vaginal, Spontaneous Delivery (Filed from Delivery Summary)    Pateint had an uncomplicated postpartum course.  She is ambulating, tolerating a regular diet, passing flatus, and urinating well. Patient is discharged home in stable condition on 02/04/2015.  BP wnl 24 hours prior to d/c. Not starting antihypertensives. Nurse visit within 48 hours for bp check.  Not known whether patient was compliant with valtrex prior to delivery (hx hsv).    Physical exam  Filed Vitals:   02/03/15 0603 02/03/15 1457 02/03/15 1800 02/04/15 0415  BP: 124/73 123/74 117/75 116/85  Pulse: 82 78 90 82  Temp: 98.2 F (36.8 C) 98.3 F (36.8 C) 98.2 F (36.8 C) 98 F (36.7 C)  TempSrc: Oral Oral Oral Oral  Resp: SpO2:  100%     General: alert, cooperative and no distress Lochia: appropriate Uterine Fundus: firm Incision: N/A DVT Evaluation: No evidence of DVT seen on  physical exam. Labs: Lab Results  Component Value Date   WBC 8.4 02/03/2015   HGB 11.2* 02/03/2015   HCT 33.7* 02/03/2015   MCV 84.5 02/03/2015   PLT 251 02/03/2015   CMP Latest Ref Rng 10/21/2014  Glucose 65 - 99 mg/dL 81  BUN 7 - 25 mg/dL 6(L)  Creatinine 5.28 - 1.10 mg/dL 4.13  Sodium 244 - 010 mmol/L 135  Potassium 3.5 - 5.3 mmol/L 4.1  Chloride 98 - 110 mmol/L 105  CO2 20 - 31 mmol/L 23  Calcium 8.6 - 10.2 mg/dL 8.7  Total Protein 6.1 - 8.1 g/dL 6.3  Total Bilirubin 0.2 - 1.2 mg/dL 0.4  Alkaline Phos 33 - 115 U/L 64  AST 10 - 30 U/L 13  ALT 6 - 29 U/L 9    Discharge instruction: per After Visit Summary and "Baby and Me Booklet".  After visit meds:    Medication List    STOP taking these medications        aspirin EC 81 MG tablet     valACYclovir 500 MG tablet  Commonly known as:  VALTREX      TAKE these medications        [employer] PRENATAL MULTI+DHA 27-0.8-250 MG Caps  Take 1 capsule by mouth.     ibuprofen 600 MG tablet  Commonly known as:  ADVIL,MOTRIN  Take 1 tablet (600 mg total) by mouth every 6 (six) hours.     Vitamin D (Ergocalciferol) 50000 units Caps capsule  Commonly known as:  DRISDOL  Take 1 capsule (50,000 Units total) by mouth every 7 (seven) days.        Diet: routine diet  Activity: Advance as tolerated. Pelvic rest for 6 weeks.   Outpatient follow up:6 weeks Follow up Appt:Future Appointments Date Time Provider Department Center  03/12/2015 12:45 PM Tereso Newcomer, MD [facility]   Follow up Visit:No Follow-up on file.  Postpartum contraception: Nexplanon as outpatient  Newborn Data: Live born female  Birth Weight: 5 lb 15.4 oz (2705 g) APGAR: ,   Baby Feeding: Bottle Disposition:home with mother   02/04/2015 Wynne Dust, MD  OB FELLOW DISCHARGE ATTESTATION  I have seen and examined this patient and agree with above documentation in the resident's note.   Silvano Bilis, MD 9:51 AM

## 2015-02-04 NOTE — Progress Notes (Signed)
MSW intern presented in patient's room to follow up with MOB about her scheduling an appointment with [facility]. MOB introduced MSW intern to her guests. MOB had her boss and coworker visiting her and therefore was reserved and limited with her answers. MOB disclosed that everything was taken care of and thanked MSW intern for stopping by. MOB requested MSW intern's contact information and expressed not feeling comfortable talking to others and because she felt comfortable with her she wanted to have a way to get a hold of her in case needs arise. MSW intern provided her with her supervisor's contact number and information on the [facility] written down on a piece of paper. MOB thanked MSW intern for stopping by and for all her help.

## 2015-02-04 NOTE — Discharge Instructions (Signed)

## 2015-02-05 ENCOUNTER — Other Ambulatory Visit: Payer: Medicaid Other

## 2015-02-08 ENCOUNTER — Inpatient Hospital Stay (HOSPITAL_COMMUNITY): Admission: RE | Admit: 2015-02-08 | Payer: Medicaid Other | Source: Ambulatory Visit

## 2015-03-12 ENCOUNTER — Ambulatory Visit (INDEPENDENT_AMBULATORY_CARE_PROVIDER_SITE_OTHER): Payer: Medicaid Other | Admitting: Obstetrics & Gynecology

## 2015-03-12 ENCOUNTER — Encounter (HOSPITAL_COMMUNITY): Payer: Self-pay

## 2015-03-12 ENCOUNTER — Telehealth: Payer: Self-pay

## 2015-03-12 VITALS — HR 82 | Wt 227.8 lb

## 2015-03-12 DIAGNOSIS — Z3042 Encounter for surveillance of injectable contraceptive: Secondary | ICD-10-CM | POA: Diagnosis not present

## 2015-03-12 LAB — POCT PREGNANCY, URINE: Preg Test, Ur: NEGATIVE

## 2015-03-12 MED ORDER — MEDROXYPROGESTERONE ACETATE 150 MG/ML IM SUSP
150.0000 mg | Freq: Once | INTRAMUSCULAR | Status: AC
  Administered 2015-03-12: 150 mg via INTRAMUSCULAR

## 2015-03-12 NOTE — Patient Instructions (Signed)
Laparoscopic bilateral sterilization scheduled 04/07/15 at 3:10 pm.  Nothing to eat or drink for 8 hours prior to surgery.  Come to hospital 1.5 hours prior to scheduled time ~ 1:30 pm.  Laparoscopic Tubal Ligation Laparoscopic tubal ligation is a procedure that closes the fallopian tubes at a time other than right after childbirth. When the fallopian tubes are closed, the eggs that are released from the ovaries cannot enter the uterus, and sperm cannot reach the egg. Tubal ligation is also known as getting your "tubes tied." Tubal ligation is done so you will not be able to get pregnant or have a baby. Although this procedure may be undone (reversed), it should be considered permanent and irreversible. If you want to have future pregnancies, you should not have this procedure. LET [facility] CARE PROVIDER KNOW ABOUT:  Any allergies you have.  All medicines you are taking, including vitamins, herbs, eye drops, creams, and over-the-counter medicines. This includes any use of steroids, either by mouth or in cream form.  Previous problems you or members of your family have had with the use of anesthetics.  Any blood disorders you have.  Previous surgeries you have had.  Any medical conditions you may have.  Possibility of pregnancy, if this applies.  Any past pregnancies. RISKS AND COMPLICATIONS  Infection.  Bleeding.  Injury to surrounding organs.  Side effects from anesthetics.  Failure of the procedure.  Ectopic pregnancy.  Future regret about having the procedure done. BEFORE THE PROCEDURE  Ask your health care provider about:  Changing or stopping your regular medicines. This is especially important if you are taking diabetes medicines or blood thinners.  Taking medicines such as aspirin and ibuprofen. These medicines can thin your blood. Do not take these medicines before your procedure if your health care provider instructs you not to.  Follow instructions from your  health care provider about eating and drinking restrictions.  Plan to have someone take you home after the procedure.  If you go home right after the procedure, plan to have someone with you for 24 hours. PROCEDURE  You will be given one or more of the following:  A medicine that helps you relax (sedative).  A medicine that numbs the area (local anesthetic).  A medicine that makes you fall asleep (general anesthetic).  A medicine that is injected into an area of your body that numbs everything below the injection site (regional anesthetic).  If you have been given general anesthetic, a tube will be put down your throat to help you breathe.  Two small cuts (incisions) will be made in the lower abdominal area and near the belly button.  Your bladder may be emptied with a small tube (catheter).  Your abdomen will be inflated with a safe gas (carbon dioxide). This will help to give the surgeon room to operate and visualize, and it will help the surgeon to avoid other organs.  A thin, lighted tube (laparoscope) with a camera attached will be inserted into your abdomen through one of the incisions near the belly button. Other small instruments will be inserted through the other abdominal incision.  The fallopian tubes will be tied off or burned (cauterized), or they will be blocked with a clip, ring, or clamp. In many cases, a small portion in the center of each fallopian tube will also be removed.  After the fallopian tubes are blocked, the gas will be released from the abdomen.  The incisions will be closed with stitches (sutures).  A bandage (dressing) will be placed over the incisions. The procedure may vary among health care providers and hospitals. AFTER THE PROCEDURE  Your blood pressure, heart rate, breathing rate, and blood oxygen level will be monitored often until the medicines you were given have worn off.  You will be given pain medicine as needed.  If you had general  anesthetic, you may have some mild discomfort in your throat. This is from the breathing tube that was placed in your throat while you were sleeping.  You may experience discomfort in the shoulder area from some trapped air between your liver and your diaphragm. This sensation is normal, and it will slowly go away on its own.  You will have some mild abdominal discomfort for 3--7 days.   This information is not intended to replace advice given to you by your health care provider. Make sure you discuss any questions you have with your health care provider.   Document Released: 04/11/2000 Document Revised: 05/20/2014 Document Reviewed: 04/16/2011 Elsevier Interactive Patient Education [employer]

## 2015-03-12 NOTE — Progress Notes (Signed)
Patient ID: Donna Thomas, female   DOB: 05/13/1985, 30 y.o.   MRN: [id number] Subjective:     Donna Thomas is a 30 y.o. [id number] female who presents for a postpartum visit. She is  postpartum following a delivery 02/02/2015}. I have fully reviewed the prenatal and intrapartum course. The delivery 39 weeks 4 days was at  gestational weeks. Outcome:vaginal delivery at home  Postpartum course has been uncomplicated. Baby's course has been uncomplicated.  Baby is feeding by breast/bottle. Bleeding is resolved. Bowel function is normal . Bladder function is normal. Patient is not sexually active. Contraception method is interested in tubal ligation.  Postpartum depression screening: negative.  The following portions of the patient's history were reviewed and updated as appropriate: allergies, current medications, past family history, past medical history, past social history, past surgical history and problem list.  Review of Systems Pertinent items noted in HPI and remainder of comprehensive ROS otherwise negative.   Objective:    Pulse 82  Wt 227 lb 12.8 oz (103.329 kg)  General:  alert and no distress   Breasts:  inspection negative, no nipple discharge or bleeding, no masses or nodularity palpable  Lungs: clear to auscultation bilaterally  Heart:  regular rate and rhythm  Abdomen: soft, non-tender; bowel sounds normal; no masses,  no organomegaly   Pelvic:  not evaluated        Assessment:   Normal postpartum exam. Pap smear not done at today's visit.   Desires tubal ligation.  Plan:    1. Contraception: Laparoscopic tubal ligation scheduled on 04/07/15 at 3:10 pm but will get Depo Provera today.  Risks of procedure discussed with patient including but not limited to: risk of regret, permanence of method, bleeding, infection, injury to surrounding organs and need for additional procedures.  Failure risk of 1-2 % with increased risk of ectopic gestation if pregnancy occurs was also  discussed with patient.  Patient verbalized understanding of these risks and wants to proceed with sterilization.  Already signed Medicaid papers on 10/13/2014, this is valid until 04/11/15.  2. Follow up as needed.  Jaynie Collins, MD, FACOG Attending Obstetrician & Gynecologist, [facility] and [facility]

## 2015-03-12 NOTE — Telephone Encounter (Signed)
Pt called and stated that she has questions about getting a BTL.  Called pt and verified with pt type of insurance she has, she informed me that she has pregnancy Medicaid and [employer] commercial insurance from her job. I advised pt to make an appt for a surgical consult for BTL and to make sure that she brings in her insurance card to the appt because it will determine how her BTL will be scheduled.  Pt stated understanding with no further questions.

## 2015-04-07 ENCOUNTER — Encounter (HOSPITAL_COMMUNITY): Payer: Self-pay | Admitting: Obstetrics & Gynecology

## 2015-04-07 ENCOUNTER — Encounter (HOSPITAL_COMMUNITY)
Admission: RE | Disposition: A | Discharge status: Home / Self Care | Payer: Self-pay | Source: Ambulatory Visit | Attending: Obstetrics & Gynecology

## 2015-04-07 ENCOUNTER — Encounter (HOSPITAL_COMMUNITY): Payer: Self-pay | Admitting: *Deleted

## 2015-04-07 ENCOUNTER — Ambulatory Visit (HOSPITAL_COMMUNITY): Payer: Medicaid Other | Admitting: Anesthesiology

## 2015-04-07 ENCOUNTER — Ambulatory Visit (HOSPITAL_COMMUNITY)
Admission: RE | Admit: 2015-04-07 | Discharge: 2015-04-07 | Disposition: A | Discharge status: Home / Self Care | Payer: Medicaid Other | Source: Ambulatory Visit | Attending: Obstetrics & Gynecology | Admitting: Obstetrics & Gynecology

## 2015-04-07 ENCOUNTER — Encounter: Payer: Self-pay | Admitting: Obstetrics & Gynecology

## 2015-04-07 DIAGNOSIS — Z302 Encounter for sterilization: Secondary | ICD-10-CM | POA: Diagnosis present

## 2015-04-07 DIAGNOSIS — I10 Essential (primary) hypertension: Secondary | ICD-10-CM | POA: Insufficient documentation

## 2015-04-07 DIAGNOSIS — K219 Gastro-esophageal reflux disease without esophagitis: Secondary | ICD-10-CM | POA: Diagnosis not present

## 2015-04-07 HISTORY — PX: LAPAROSCOPIC TUBAL LIGATION: SHX1937

## 2015-04-07 HISTORY — DX: Gastro-esophageal reflux disease without esophagitis: K21.9

## 2015-04-07 HISTORY — DX: Malignant hyperthermia due to anesthesia, initial encounter: T88.3XXA

## 2015-04-07 LAB — CBC
HEMATOCRIT: 40.4 % (ref 36.0–46.0)
HEMOGLOBIN: 13.2 g/dL (ref 12.0–15.0)
MCH: 28.1 pg (ref 26.0–34.0)
MCHC: 32.7 g/dL (ref 30.0–36.0)
MCV: 86 fL (ref 78.0–100.0)
Platelets: 320 10*3/uL (ref 150–400)
RBC: 4.7 MIL/uL (ref 3.87–5.11)
RDW: 15.1 % (ref 11.5–15.5)
WBC: 5.8 10*3/uL (ref 4.0–10.5)

## 2015-04-07 LAB — PREGNANCY, URINE: Preg Test, Ur: NEGATIVE

## 2015-04-07 SURGERY — LIGATION, FALLOPIAN TUBE, LAPAROSCOPIC
Anesthesia: General | Site: Abdomen | Laterality: Bilateral

## 2015-04-07 MED ORDER — PROPOFOL 500 MG/50ML IV EMUL
INTRAVENOUS | Status: DC | PRN
  Administered 2015-04-07: 200 ug/kg/min via INTRAVENOUS

## 2015-04-07 MED ORDER — LABETALOL HCL 5 MG/ML IV SOLN
INTRAVENOUS | Status: AC
  Filled 2015-04-07: qty 4

## 2015-04-07 MED ORDER — NEOSTIGMINE METHYLSULFATE 10 MG/10ML IV SOLN
INTRAVENOUS | Status: AC
  Filled 2015-04-07: qty 1

## 2015-04-07 MED ORDER — OXYCODONE-ACETAMINOPHEN 5-325 MG PO TABS
1.0000 | ORAL_TABLET | Freq: Four times a day (QID) | ORAL | Status: DC | PRN
Start: 2015-04-07 — End: 2021-11-16

## 2015-04-07 MED ORDER — ONDANSETRON HCL 4 MG/2ML IJ SOLN
INTRAMUSCULAR | Status: AC
  Filled 2015-04-07: qty 2

## 2015-04-07 MED ORDER — LIDOCAINE HCL (CARDIAC) 20 MG/ML IV SOLN
INTRAVENOUS | Status: AC
  Filled 2015-04-07: qty 5

## 2015-04-07 MED ORDER — MIDAZOLAM HCL 2 MG/2ML IJ SOLN
INTRAMUSCULAR | Status: AC
  Filled 2015-04-07: qty 2

## 2015-04-07 MED ORDER — KETOROLAC TROMETHAMINE 30 MG/ML IJ SOLN
INTRAMUSCULAR | Status: AC
  Filled 2015-04-07: qty 1

## 2015-04-07 MED ORDER — DEXAMETHASONE SODIUM PHOSPHATE 4 MG/ML IJ SOLN
INTRAMUSCULAR | Status: AC
  Filled 2015-04-07: qty 1

## 2015-04-07 MED ORDER — BUPIVACAINE HCL (PF) 0.5 % IJ SOLN
INTRAMUSCULAR | Status: AC
Start: 2015-04-07 — End: 2015-04-07
  Filled 2015-04-07: qty 30

## 2015-04-07 MED ORDER — BUPIVACAINE HCL (PF) 0.5 % IJ SOLN
INTRAMUSCULAR | Status: DC | PRN
  Administered 2015-04-07: 30 mL

## 2015-04-07 MED ORDER — LACTATED RINGERS IV SOLN
INTRAVENOUS | Status: DC
  Administered 2015-04-07 (×2): via INTRAVENOUS

## 2015-04-07 MED ORDER — ONDANSETRON HCL 4 MG/2ML IJ SOLN
INTRAMUSCULAR | Status: DC | PRN
Start: 2015-04-07 — End: 2015-04-07
  Administered 2015-04-07: 4 mg via INTRAVENOUS

## 2015-04-07 MED ORDER — IBUPROFEN 600 MG PO TABS: 600.0000 mg | ORAL_TABLET | Freq: Four times a day (QID) | ORAL | Status: DC

## 2015-04-07 MED ORDER — PROPOFOL 500 MG/50ML IV EMUL
INTRAVENOUS | Status: AC
  Filled 2015-04-07: qty 100

## 2015-04-07 MED ORDER — FENTANYL CITRATE (PF) 100 MCG/2ML IJ SOLN
INTRAMUSCULAR | Status: DC | PRN
  Administered 2015-04-07: 100 ug via INTRAVENOUS
  Administered 2015-04-07: 150 ug via INTRAVENOUS

## 2015-04-07 MED ORDER — LACTATED RINGERS IV SOLN: INTRAVENOUS | Status: DC

## 2015-04-07 MED ORDER — PROPOFOL 10 MG/ML IV BOLUS
INTRAVENOUS | Status: AC
  Filled 2015-04-07: qty 20

## 2015-04-07 MED ORDER — MIDAZOLAM HCL 2 MG/2ML IJ SOLN
INTRAMUSCULAR | Status: DC | PRN
  Administered 2015-04-07 (×2): 2 mg via INTRAVENOUS

## 2015-04-07 MED ORDER — SCOPOLAMINE 1 MG/3DAYS TD PT72
MEDICATED_PATCH | TRANSDERMAL | Status: AC
  Administered 2015-04-07: 1.5 mg via TRANSDERMAL
  Filled 2015-04-07: qty 1

## 2015-04-07 MED ORDER — LABETALOL HCL 5 MG/ML IV SOLN
INTRAVENOUS | Status: DC | PRN
  Administered 2015-04-07 (×3): 5 mg via INTRAVENOUS

## 2015-04-07 MED ORDER — FENTANYL CITRATE (PF) 250 MCG/5ML IJ SOLN
INTRAMUSCULAR | Status: AC
  Filled 2015-04-07: qty 5

## 2015-04-07 MED ORDER — GLYCOPYRROLATE 0.2 MG/ML IJ SOLN
INTRAMUSCULAR | Status: AC
  Filled 2015-04-07: qty 4

## 2015-04-07 MED ORDER — FENTANYL CITRATE (PF) 100 MCG/2ML IJ SOLN: 25.0000 ug | INTRAMUSCULAR | Status: DC | PRN

## 2015-04-07 MED ORDER — LIDOCAINE HCL (CARDIAC) 20 MG/ML IV SOLN
INTRAVENOUS | Status: DC | PRN
  Administered 2015-04-07: 100 mg via INTRAVENOUS

## 2015-04-07 MED ORDER — PROPOFOL 10 MG/ML IV BOLUS
INTRAVENOUS | Status: DC | PRN
  Administered 2015-04-07: 50 mg via INTRAVENOUS
  Administered 2015-04-07: 200 mg via INTRAVENOUS
  Administered 2015-04-07: 20 mg via INTRAVENOUS

## 2015-04-07 MED ORDER — ROCURONIUM BROMIDE 100 MG/10ML IV SOLN
INTRAVENOUS | Status: DC | PRN
  Administered 2015-04-07: 40 mg via INTRAVENOUS

## 2015-04-07 MED ORDER — NEOSTIGMINE METHYLSULFATE 10 MG/10ML IV SOLN
INTRAVENOUS | Status: DC | PRN
  Administered 2015-04-07: 5 mg via INTRAVENOUS

## 2015-04-07 MED ORDER — DEXAMETHASONE SODIUM PHOSPHATE 10 MG/ML IJ SOLN
INTRAMUSCULAR | Status: DC | PRN
  Administered 2015-04-07: 4 mg via INTRAVENOUS

## 2015-04-07 MED ORDER — SCOPOLAMINE 1 MG/3DAYS TD PT72
1.0000 | MEDICATED_PATCH | Freq: Once | TRANSDERMAL | Status: DC
  Administered 2015-04-07: 1.5 mg via TRANSDERMAL

## 2015-04-07 MED ORDER — GLYCOPYRROLATE 0.2 MG/ML IJ SOLN
INTRAMUSCULAR | Status: DC | PRN
  Administered 2015-04-07: .8 mg via INTRAVENOUS

## 2015-04-07 MED ORDER — DOCUSATE SODIUM 100 MG PO CAPS: 100.0000 mg | ORAL_CAPSULE | Freq: Two times a day (BID) | ORAL | Status: AC | PRN

## 2015-04-07 SURGICAL SUPPLY — 21 items
CATH ROBINSON RED A/P 16FR (CATHETERS) ×2 IMPLANT
CLIP FILSHIE TUBAL LIGA STRL (Clip) ×2 IMPLANT
CLOTH BEACON ORANGE TIMEOUT ST (SAFETY) ×2 IMPLANT
DRSG COVADERM PLUS 2X2 (GAUZE/BANDAGES/DRESSINGS) IMPLANT
DRSG OPSITE POSTOP 3X4 (GAUZE/BANDAGES/DRESSINGS) ×2 IMPLANT
DURAPREP 26ML APPLICATOR (WOUND CARE) ×2 IMPLANT
GLOVE BIOGEL PI IND STRL 7.0 (GLOVE) ×3 IMPLANT
GLOVE BIOGEL PI INDICATOR 7.0 (GLOVE) ×3
GLOVE ECLIPSE 7.0 STRL STRAW (GLOVE) ×2 IMPLANT
GOWN STRL REUS W/TWL LRG LVL3 (GOWN DISPOSABLE) ×4 IMPLANT
LIQUID BAND (GAUZE/BANDAGES/DRESSINGS) ×2 IMPLANT
NEEDLE HYPO 22GX1.5 SAFETY (NEEDLE) ×2 IMPLANT
PACK LAPAROSCOPY BASIN (CUSTOM PROCEDURE TRAY) ×2 IMPLANT
PAD TRENDELENBURG POSITION (MISCELLANEOUS) ×2 IMPLANT
SLEEVE XCEL OPT CAN 5 100 (ENDOMECHANICALS) IMPLANT
SUT VIC AB 3-0 X1 27 (SUTURE) ×2 IMPLANT
SUT VICRYL 0 UR6 27IN ABS (SUTURE) ×2 IMPLANT
SYR 30ML LL (SYRINGE) ×2 IMPLANT
TOWEL OR 17X24 6PK STRL BLUE (TOWEL DISPOSABLE) ×4 IMPLANT
TROCAR XCEL NON-BLD 11X100MML (ENDOMECHANICALS) ×2 IMPLANT
TROCAR XCEL NON-BLD 5MMX100MML (ENDOMECHANICALS) IMPLANT

## 2015-04-07 NOTE — Discharge Instructions (Addendum)
Laparoscopic Tubal Ligation, Care After °Refer to this sheet in the next few weeks. These instructions provide you with information about caring for yourself after your procedure. Your health care provider may also give you more specific instructions. Your treatment has been planned according to current medical practices, but problems sometimes occur. Call your health care provider if you have any problems or questions after your procedure. °WHAT TO EXPECT AFTER THE PROCEDURE °After your procedure, it is common to have: °· Sore throat. °· Soreness at the incision site. °· Mild cramping. °· Tiredness. °· Mild nausea or vomiting. °· Shoulder pain. °HOME CARE INSTRUCTIONS °· Rest for the remainder of the day. °· Take medicines only as directed by your health care provider. These include over-the-counter medicines and prescription medicines. Do not take aspirin, which can cause bleeding. °· Over the next few days, gradually return to your normal activities and your normal diet. °· Avoid sexual intercourse for 2 weeks or as directed by your health care provider. °· Do not use tampons, and do not douche. °· Do not drive or operate heavy machinery while taking pain medicine. °· Do not lift anything that is heavier than 5 lb (2.3 kg) for 2 weeks or as directed by your health care provider. °· Do not take baths. Take showers only. Ask your health care provider when you can start taking baths. °· Take your temperature twice each day and write it down. °· Try to have help for your household needs for the first 7-10 days. °· There are many different ways to close and cover an incision, including stitches (sutures), skin glue, and adhesive strips. Follow instructions from your health care provider about: °¨ Incision care. °¨ Bandage (dressing) changes and removal. °¨ Incision closure removal. °· Check your incision area every day for signs of infection. Watch for: °¨ Redness, swelling, or pain. °¨ Fluid, blood, or pus. °· Keep  all follow-up visits as directed by your health care provider. °SEEK MEDICAL CARE IF: °· You have redness, swelling, or increasing pain in your incision area. °· You have fluid, blood, or pus coming from your incision for longer than 1 day. °· You notice a bad smell coming from your incision or your dressing. °· The edges of your incision break open after the sutures have been removed. °· Your pain does not decrease after 2-3 days. °· You have a rash. °· You repeatedly become dizzy or light-headed. °· You have a reaction to your medicine. °· Your pain medicine is not helping. °· You are constipated. °SEEK IMMEDIATE MEDICAL CARE IF: °· You have a fever. °· You faint. °· You have increasing pain in your abdomen. °· You have severe pain in one or both of your shoulders. °· You have bleeding or drainage from your suture sites or your vagina after surgery. °· You have shortness of breath or have difficulty breathing. °· You have chest pain or leg pain. °· You have ongoing nausea, vomiting, or diarrhea. °  °This information is not intended to replace advice given to you by your health care provider. Make sure you discuss any questions you have with your health care provider. °  °Document Released: 07/23/2004 Document Revised: 05/20/2014 Document Reviewed: 04/16/2011 °Elsevier Interactive Patient Education ©2016 Else[employer] Inc[employer] ° ° °Post Anesthesia Home Care Instructions ° °Activity: °Get plenty of rest for the remainder of the day. A responsible adult should stay with you for 24 hours following the procedure.  °For the next 24 hours, DO NOT: °-Drive   a car °-Operate machinery °-Drink alcoholic beverages °-Take any medication unless instructed by your physician °-Make any legal decisions or sign important papers. ° °Meals: °Start with liquid foods such as gelatin or soup. Progress to regular foods as tolerated. Avoid greasy, spicy, heavy foods. If nausea and/or vomiting occur, drink only clear liquids until the nausea and/or  vomiting subsides. Call your physician if vomiting continues. ° °Special Instructions/Symptoms: °Your throat may feel dry or sore from the anesthesia or the breathing tube placed in your throat during surgery. If this causes discomfort, gargle with warm salt water. The discomfort should disappear within 24 hours. ° °If you had a scopolamine patch placed behind your ear for the management of post- operative nausea and/or vomiting: ° °1. The medication in the patch is effective for 72 hours, after which it should be removed.  Wrap patch in a tissue and discard in the trash. Wash hands thoroughly with soap and water. °2. You may remove the patch earlier than 72 hours if you experience unpleasant side effects which may include dry mouth, dizziness or visual disturbances. °3. Avoid touching the patch. Wash your hands with soap and water after contact with the patch. ° ° °

## 2015-04-07 NOTE — Interval H&P Note (Signed)
History and Physical Interval Note 04/07/2015 2:02 PM  Donna Jones BroomM Thomas  has presented today for surgery, with the diagnosis of DESIRES STERILIZATION  The various methods of treatment have been discussed with the patient and family. After consideration of risks, benefits and other options for treatment, the patient has consented to LAPAROSCOPIC BILATERAL TUBAL LIGATION WITH FILSHIE CLIPS as a surgical intervention.  The patient's history has been reviewed, patient examined, no change in status, stable for surgery.  I have reviewed the patient's chart and labs.  Questions were answered to the patient's satisfaction.  To OR when ready.   Jaynie CollinsUGONNA  Lindey Renzulli, MD, FACOG Attending Obstetrician & Gynecologist, [facility]

## 2015-04-07 NOTE — Anesthesia Postprocedure Evaluation (Signed)
Anesthesia Post Note  Patient: Donna Thomas  Procedure(s) Performed: Procedure(s) (LRB): LAPAROSCOPIC BILATERAL TUBAL LIGATION WITH FILSHIE CLIPS (Bilateral)  Patient location during evaluation: PACU Anesthesia Type: General Level of consciousness: sedated Pain management: satisfactory to patient Vital Signs Assessment: post-procedure vital signs reviewed and stable Respiratory status: spontaneous breathing Cardiovascular status: stable Anesthetic complications: no    Last Vitals:  Filed Vitals:   04/07/15 1615 04/07/15 1630  BP: 130/89 127/93  Pulse: 67 69  Temp:  36.7 C  Resp: 15 14    Last Pain: There were no vitals filed for this visit.               Jiles GarterJACKSON,Trek Kimball EDWARD

## 2015-04-07 NOTE — Anesthesia Preprocedure Evaluation (Signed)
Anesthesia Evaluation  Patient identified by MRN, date of birth, ID band Patient awake    Reviewed: Allergy & Precautions, H&P , Patient's Chart, lab work & pertinent test results, reviewed documented beta blocker date and time   History of Anesthesia Complications (+) MALIGNANT HYPERTHERMIA and history of anesthetic complications  Airway Mallampati: II  TM Distance: >3 FB Neck ROM: full    Dental no notable dental hx.    Pulmonary    Pulmonary exam normal breath sounds clear to auscultation       Cardiovascular hypertension,  Rhythm:regular Rate:Normal     Neuro/Psych    GI/Hepatic GERD  ,  Endo/Other    Renal/GU      Musculoskeletal   Abdominal   Peds  Hematology   Anesthesia Other Findings   Reproductive/Obstetrics                             Anesthesia Physical Anesthesia Plan  ASA: II  Anesthesia Plan: General   Post-op Pain Management:    Induction: Intravenous  Airway Management Planned: Oral ETT and Video Laryngoscope Planned  Additional Equipment:   Intra-op Plan:   Post-operative Plan: Extubation in OR  Informed Consent: I have reviewed the patients History and Physical, chart, labs and discussed the procedure including the risks, benefits and alternatives for the proposed anesthesia with the patient or authorized representative who has indicated his/her understanding and acceptance.   Dental Advisory Given and Dental advisory given  Plan Discussed with: CRNA and Surgeon  Anesthesia Plan Comments: (MH concerns addressed  Discussed general anesthesia with TIVA, including possible nausea, instrumentation of airway, sore throat,pulmonary aspiration, etc. I asked if the were any outstanding questions, or  concerns before we proceeded. )        Anesthesia Quick Evaluation

## 2015-04-07 NOTE — Anesthesia Procedure Notes (Signed)
Procedure Name: Intubation Date/Time: 04/07/2015 2:32 PM Performed by: Elgie CongoMALINOVA, Ezabella Teska H Pre-anesthesia Checklist: Patient identified, Emergency Drugs available, Suction available and Patient being monitored Patient Re-evaluated:Patient Re-evaluated prior to inductionOxygen Delivery Method: Circle system utilized Preoxygenation: Pre-oxygenation with 100% oxygen Intubation Type: IV induction Ventilation: Mask ventilation without difficulty Laryngoscope Size: Glidescope Grade View: Grade I Tube type: Oral Tube size: 7.0 mm Number of attempts: 1 Airway Equipment and Method: Rigid stylet Placement Confirmation: ETT inserted through vocal cords under direct vision,  positive ETCO2 and breath sounds checked- equal and bilateral Secured at: 21 cm Tube secured with: Tape Dental Injury: Teeth and Oropharynx as per pre-operative assessment

## 2015-04-07 NOTE — Op Note (Signed)
Donna Thomas BroomM Shieh 04/07/2015  PREOPERATIVE DIAGNOSIS:  Undesired fertility  POSTOPERATIVE DIAGNOSIS:  Undesired fertility  PROCEDURE:  Laparoscopic Bilateral Tubal Sterilization using Filshie Clips   SURGEON: Jaynie CollinsUgonna Fauna Neuner, MD  ANESTHESIA:  General endotracheal  COMPLICATIONS:  None immediate.  ESTIMATED BLOOD LOSS:  Minimal  FLUIDS: 1000 ml LR.  URINE OUTPUT:  50 ml of clear urine.  INDICATIONS: 30 y.o. [id number]  with undesired fertility, desires permanent sterilization. Other reversible forms of contraception were discussed with patient; she declines all other modalities.  Risks of procedure discussed with patient including permanence of method, bleeding, infection, injury to surrounding organs and need for additional procedures including laparotomy, risk of regret.  Failure risk of 0.5-1% with increased risk of ectopic gestation if pregnancy occurs was also discussed with patient.      FINDINGS:  Normal uterus, tubes, and ovaries.  TECHNIQUE:  The patient was taken to the operating room where general anesthesia was obtained without difficulty.  She was then placed in the dorsal lithotomy position and prepared and draped in sterile fashion.  After an adequate timeout was performed, a bivalved speculum was then placed in the patient's vagina, and the anterior lip of cervix grasped with the single-tooth tenaculum.  The uterine manipulator was then advanced into the uterus.  The speculum was removed from the vagina.  Attention was then turned to the patient's abdomen where a 11-mm skin incision was made in the umbilical fold.  The Optiview 11-mm trocar and sleeve were then advanced without difficulty with the laparoscope under direct visualization into the abdomen.  The abdomen was then insufflated with carbon dioxide gas and adequate pneumoperitoneum was obtained.  A survey of the patient's pelvis and abdomen revealed entirely normal anatomy.  The fallopian tubes were observed and found to  be normal in appearance. The Filshie clip applicator was placed through the operative port, and a Filshie clip was placed on the right fallopian tube, about 2 cm from the cornual attachment, with care given to incorporate the underlying mesosalpinx.  A similar process was carried out on the contralateral side allowing for bilateral tubal sterilization.   Good hemostasis was noted overall.  Local analgesia was drizzled on both operative sites.The instruments were then removed from the patient's abdomen and the fascial incision was repaired with 0 Vicryl, and the skin was closed with 3-0 Vicryl and Dermabond.  The uterine manipulator and the tenaculum were removed from the vagina without complications. The patient tolerated the procedure well.  Sponge, lap, and needle counts were correct times two.  The patient was then taken to the recovery room awake, extubated and in stable condition.   Jaynie CollinsUGONNA  Kejuan Bekker, MD, FACOG Attending Obstetrician & Gynecologist, [facility]

## 2015-04-07 NOTE — Transfer of Care (Signed)
Immediate Anesthesia Transfer of Care Note  Patient: Donna Thomas  Procedure(s) Performed: Procedure(s): LAPAROSCOPIC BILATERAL TUBAL LIGATION WITH FILSHIE CLIPS (Bilateral)  Patient Location: PACU  Anesthesia Type:General  Level of Consciousness: awake, alert , oriented and patient cooperative  Airway & Oxygen Therapy: Patient Spontanous Breathing and Patient connected to nasal cannula oxygen  Post-op Assessment: Report given to RN and Post -op Vital signs reviewed and stable  Post vital signs: Reviewed and stable  Last Vitals:  Filed Vitals:   04/07/15 1350  BP: 141/91  Pulse: 94  Temp: 37 C  Resp: 18    Complications: No apparent anesthesia complications

## 2015-04-07 NOTE — H&P (View-Only) (Signed)
Patient ID: Donna Thomas, female   DOB: 03/07/1985, 30 y.o.   MRN: [id number] Subjective:     Donna Thomas is a 30 y.o. [id number] female who presents for a postpartum visit. She is  postpartum following a delivery 02/02/2015}. I have fully reviewed the prenatal and intrapartum course. The delivery 39 weeks 4 days was at  gestational weeks. Outcome:vaginal delivery at home  Postpartum course has been uncomplicated. Baby's course has been uncomplicated.  Baby is feeding by breast/bottle. Bleeding is resolved. Bowel function is normal . Bladder function is normal. Patient is not sexually active. Contraception method is interested in tubal ligation.  Postpartum depression screening: negative.  The following portions of the patient's history were reviewed and updated as appropriate: allergies, current medications, past family history, past medical history, past social history, past surgical history and problem list.  Review of Systems Pertinent items noted in HPI and remainder of comprehensive ROS otherwise negative.   Objective:    Pulse 82  Wt 227 lb 12.8 oz (103.329 kg)  General:  alert and no distress   Breasts:  inspection negative, no nipple discharge or bleeding, no masses or nodularity palpable  Lungs: clear to auscultation bilaterally  Heart:  regular rate and rhythm  Abdomen: soft, non-tender; bowel sounds normal; no masses,  no organomegaly   Pelvic:  not evaluated        Assessment:   Normal postpartum exam. Pap smear not done at today's visit.   Desires tubal ligation.  Plan:    1. Contraception: Laparoscopic tubal ligation scheduled on 04/07/15 at 3:10 pm but will get Depo Provera today.  Risks of procedure discussed with patient including but not limited to: risk of regret, permanence of method, bleeding, infection, injury to surrounding organs and need for additional procedures.  Failure risk of 1-2 % with increased risk of ectopic gestation if pregnancy occurs was also  discussed with patient.  Patient verbalized understanding of these risks and wants to proceed with sterilization.  Already signed Medicaid papers on 10/13/2014, this is valid until 04/11/15.  2. Follow up as needed.  Jaynie CollinsUGONNA  ANYANWU, MD, FACOG Attending Obstetrician & Gynecologist, [facility]

## 2015-04-08 ENCOUNTER — Encounter (HOSPITAL_COMMUNITY): Payer: Self-pay | Admitting: Obstetrics & Gynecology

## 2015-04-29 ENCOUNTER — Ambulatory Visit: Payer: Medicaid Other | Admitting: Obstetrics & Gynecology

## 2016-01-06 ENCOUNTER — Other Ambulatory Visit: Payer: Self-pay | Admitting: Obstetrics & Gynecology

## 2016-01-06 DIAGNOSIS — A6000 Herpesviral infection of urogenital system, unspecified: Secondary | ICD-10-CM

## 2019-08-12 ENCOUNTER — Telehealth: Payer: Self-pay | Admitting: Obstetrics & Gynecology

## 2019-08-12 NOTE — Telephone Encounter (Signed)
This patient stated she wanted to get an ultrasound scheduled. She stated her tubes are tied, and she can feel something moving. She does not want to see a doctor, or anything else. She just want's to get an ultrasound done. Requesting a call to speak with clinical staff.

## 2019-08-13 NOTE — Telephone Encounter (Signed)
Called pt; VM left requesting pt call our office with any continued questions or concerns. Callback number given. MyChart message sent.

## 2019-09-18 ENCOUNTER — Ambulatory Visit: Payer: Self-pay | Admitting: Obstetrics & Gynecology

## 2021-10-28 ENCOUNTER — Encounter (HOSPITAL_COMMUNITY): Payer: Self-pay | Admitting: Emergency Medicine

## 2021-10-28 ENCOUNTER — Other Ambulatory Visit: Payer: Self-pay

## 2021-10-28 ENCOUNTER — Ambulatory Visit (HOSPITAL_COMMUNITY): Payer: Medicaid Other

## 2021-10-28 ENCOUNTER — Ambulatory Visit (INDEPENDENT_AMBULATORY_CARE_PROVIDER_SITE_OTHER): Payer: Medicaid Other

## 2021-10-28 ENCOUNTER — Ambulatory Visit (HOSPITAL_COMMUNITY)
Admission: EM | Admit: 2021-10-28 | Discharge: 2021-10-28 | Disposition: A | Discharge status: Home / Self Care | Payer: Medicaid Other | Attending: Physician Assistant | Admitting: Physician Assistant

## 2021-10-28 DIAGNOSIS — W19XXXA Unspecified fall, initial encounter: Secondary | ICD-10-CM

## 2021-10-28 DIAGNOSIS — M25551 Pain in right hip: Secondary | ICD-10-CM | POA: Diagnosis not present

## 2021-10-28 DIAGNOSIS — M25552 Pain in left hip: Secondary | ICD-10-CM | POA: Diagnosis not present

## 2021-10-28 DIAGNOSIS — W06XXXA Fall from bed, initial encounter: Secondary | ICD-10-CM | POA: Diagnosis not present

## 2021-10-28 DIAGNOSIS — M5441 Lumbago with sciatica, right side: Secondary | ICD-10-CM

## 2021-10-28 DIAGNOSIS — M545 Low back pain, unspecified: Secondary | ICD-10-CM | POA: Diagnosis not present

## 2021-10-28 LAB — POCT URINALYSIS DIPSTICK, ED / UC
Bilirubin Urine: NEGATIVE
Glucose, UA: NEGATIVE mg/dL
Leukocytes,Ua: NEGATIVE
Nitrite: NEGATIVE
Protein, ur: NEGATIVE mg/dL
Specific Gravity, Urine: >=1.03 (ref 1.005–1.030)
Urobilinogen, UA: 1 mg/dL (ref 0.0–1.0)
pH: 5.5 (ref 5.0–8.0)

## 2021-10-28 LAB — POC URINE PREG, ED: Preg Test, Ur: NEGATIVE

## 2021-10-28 MED ORDER — METHOCARBAMOL 500 MG PO TABS: 500.0000 mg | ORAL_TABLET | Freq: Two times a day (BID) | ORAL | 0 refills | Status: DC

## 2021-10-28 MED ORDER — NAPROXEN 500 MG PO TABS: 500.0000 mg | ORAL_TABLET | Freq: Two times a day (BID) | ORAL | 0 refills | Status: DC

## 2021-10-28 NOTE — ED Provider Notes (Signed)
[place]    CSN: [id number] Arrival date & time: 10/28/21  1118      History   Chief Complaint Chief Complaint  Patient presents with   Flank Pain   Fall    HPI Donna Thomas is a 36 y.o. female.   Patient presents today with several month history of intermittent but worsening lower back and hip pain.  Reports that several months ago she fell off the bed onto her right side.  Since that time she has had ongoing pain but recently it has become worse.  Currently it is rated 9 on a 0-10 pain scale, localized to lumbar back with radiation into lateral right hip and leg, described as sharp, worse with certain movements or exposure to cold air, no alleviating factors identified.  She has tried ibuprofen as well as topical analgesics without improvement of symptoms.  She denies history of back injury or surgery.  Denies history of malignancy.  Denies any bowel/bladder incontinence, lower extremity weakness, saddle anesthesia.  She denies history of spinal surgery.  She is having difficulty with daily activities as result of symptoms.    Past Medical History:  Diagnosis Date   Bipolar I disorder, single manic episode ([place])    Complication of anesthesia    middle sister had malignant hyperthermia   GERD (gastroesophageal reflux disease)    Headache(784.0)    History of depression    History of HPV infection    HSV infection    Hx of abnormal cervical Pap smear    ASCUS   Hypertension    Infection    Malignant hyperthermia    family history:  middle sister   Pregnancy induced hypertension    Vaginal delivery 2008, 2014, 2017    Patient Active Problem List   Diagnosis Date Noted   Encounter for female sterilization procedure 04/07/2015   Essential hypertension 09/29/2014   Obesity 09/01/2014   ASCUS with positive high risk HPV 05/31/2012   Clinical depression 04/26/2011   Avitaminosis D 12/02/2010   Bipolar I disorder, single manic episode ([place]) 04/26/2010    Genital herpes 04/26/2010    Past Surgical History:  Procedure Laterality Date   LAPAROSCOPIC TUBAL LIGATION Bilateral 04/07/2015   Procedure: LAPAROSCOPIC BILATERAL TUBAL LIGATION WITH FILSHIE CLIPS;  Surgeon: Osborne Oman, MD;  Location: [facility];  Service: Gynecology;  Laterality: Bilateral;    OB History     Gravida  3   Para  3   Term  3   Preterm      AB      Living  3      SAB      IAB      Ectopic      Multiple  0   Live Births  3            Home Medications    Prior to Admission medications   Medication Sig Start Date End Date Taking? Authorizing Provider  methocarbamol (ROBAXIN) 500 MG tablet Take 1 tablet (500 mg total) by mouth 2 (two) times daily. 10/28/21  Yes Shaylea Ucci K, PA-C  naproxen (NAPROSYN) 500 MG tablet Take 1 tablet (500 mg total) by mouth 2 (two) times daily. 10/28/21  Yes Tehila Sokolow K, PA-C  docusate sodium (COLACE) 100 MG capsule Take 1 capsule (100 mg total) by mouth 2 (two) times daily as needed. 04/07/15   Anyanwu, Sallyanne Havers, MD  oxyCODONE-acetaminophen (PERCOCET/ROXICET) 5-325 MG tablet Take 1-2 tablets by mouth every 6 (  six) hours as needed. Patient not taking: Reported on 10/28/2021 04/07/15   Osborne Oman, MD  valACYclovir (VALTREX) 500 MG tablet Take 500 mg by mouth 2 (two) times daily.    [provider]  valACYclovir (VALTREX) 500 MG tablet TAKE 1 TABLET BY MOUTH TWICE A DAY 01/07/16   Anyanwu, Sallyanne Havers, MD  Vitamin D, Ergocalciferol, (DRISDOL) 50000 UNITS CAPS capsule Take 1 capsule (50,000 Units total) by mouth every 7 (seven) days. 10/15/14   Guss Bunde, MD    Family History Family History  Problem Relation Age of Onset   Diabetes Maternal Grandmother    Cancer Maternal Grandmother     Social History Social History   Tobacco Use   Smoking status: Never   Smokeless tobacco: Never  Vaping Use   Vaping Use: Never used  Substance Use Topics   Alcohol use: Yes   Drug use: Yes    Types:  Marijuana     Allergies   Patient has no known allergies.   Review of Systems Review of Systems  Constitutional:  Positive for activity change. Negative for appetite change, fatigue and fever.  Respiratory:  Negative for cough and shortness of breath.   Cardiovascular:  Negative for chest pain.  Gastrointestinal:  Negative for abdominal pain, diarrhea, nausea and vomiting.  Genitourinary:  Negative for dysuria, frequency, urgency, vaginal bleeding, vaginal discharge and vaginal pain.  Musculoskeletal:  Positive for arthralgias and back pain. Negative for myalgias.     Physical Exam Triage Vital Signs ED Triage Vitals  Enc Vitals Group     BP 10/28/21 1156 (!) 138/103     Pulse Rate 10/28/21 1156 97     Resp 10/28/21 1156 20     Temp 10/28/21 1156 99.1 F (37.3 C)     Temp Source 10/28/21 1156 Oral     SpO2 10/28/21 1156 96 %     Weight --      Height --      Head Circumference --      Peak Flow --      Pain Score 10/28/21 1153 8     Pain Loc --      Pain Edu? --      Excl. in [place]? --    No data found.  Updated Vital Signs BP (!) 138/103 (BP Location: Left Arm) Comment: large cuff  Pulse 97   Temp 99.1 F (37.3 C) (Oral)   Resp 20   SpO2 96%   Visual Acuity Right Eye Distance:   Left Eye Distance:   Bilateral Distance:    Right Eye Near:   Left Eye Near:    Bilateral Near:     Physical Exam Vitals reviewed.  Constitutional:      General: She is awake. She is not in acute distress.    Appearance: Normal appearance. She is well-developed. She is not ill-appearing.     Comments: Very pleasant female appears stated age in no acute distress sitting comfortably in exam room  HENT:     Head: Normocephalic and atraumatic.  Cardiovascular:     Rate and Rhythm: Normal rate and regular rhythm.     Heart sounds: Normal heart sounds, S1 normal and S2 normal. No murmur heard. Pulmonary:     Effort: Pulmonary effort is normal.     Breath sounds: Normal breath  sounds. No wheezing, rhonchi or rales.     Comments: Clear to auscultation bilaterally Abdominal:     General: Bowel sounds are normal.  Palpations: Abdomen is soft.     Tenderness: There is no abdominal tenderness. There is no right CVA tenderness, left CVA tenderness, guarding or rebound.     Comments: Benign abdominal exam  Musculoskeletal:     Cervical back: No tenderness or bony tenderness.     Thoracic back: No tenderness or bony tenderness.     Lumbar back: Tenderness and bony tenderness present. Negative right straight leg raise test and negative left straight leg raise test.     Right hip: Tenderness and bony tenderness present. Decreased range of motion. Normal strength.     Comments: Back: Pain percussion of lumbar vertebrae.  No deformity or step-off noted.  Negative straight leg raise bilaterally.  Tenderness palpation over right paraspinal muscles.  Hips: Tenderness palpation over lateral bilateral hip.  No deformity noted.  Normal gait.  Strength 5/5 bilateral lower extremities.   Psychiatric:        Behavior: Behavior is cooperative.      UC Treatments / Results  Labs (all labs ordered are listed, but only abnormal results are displayed) Labs Reviewed  POCT URINALYSIS DIPSTICK, ED / UC - Abnormal; Notable for the following components:      Result Value   Ketones, ur TRACE (*)    Hgb urine dipstick TRACE (*)    All other components within normal limits  POC URINE PREG, ED    EKG   Radiology DG Lumbar Spine Complete  Result Date: 10/28/2021 CLINICAL DATA:  Fall from bed.  Bilateral flank pain. EXAM: LUMBAR SPINE - COMPLETE 4+ VIEW COMPARISON:  None Available. FINDINGS: Detail limited by body habitus. Transitional lumbosacral anatomy with only 4 rib-bearing lumbar type vertebral bodies. The alignment appears normal. The disc spaces are preserved. No evidence of acute fracture or pars defect. Probable mild facet degenerative changes inferiorly. Bilateral tubal  ligation clips. IMPRESSION: No evidence of acute lumbar spine injury. Transitional lumbosacral anatomy. Electronically Signed   By: Richardean Sale M.D.   On: 10/28/2021 13:17   DG Hip Unilat With Pelvis 2-3 Views Left  Result Date: 10/28/2021 CLINICAL DATA:  Flank pain, fall EXAM: DG HIP (WITH OR WITHOUT PELVIS) 3V LEFT COMPARISON:  None Available. FINDINGS: There is no evidence of hip fracture or dislocation. There is no evidence of arthropathy or other focal bone abnormality. IMPRESSION: Negative. Electronically Signed   By: Merilyn Baba M.D.   On: 10/28/2021 13:16    Procedures Procedures (including critical care time)  Medications Ordered in UC Medications - No data to display  Initial Impression / Assessment and Plan / UC Course  I have reviewed the triage vital signs and the nursing notes.  Pertinent labs & imaging results that were available during my care of the patient were reviewed by me and considered in my medical decision making (see chart for details).     Urine pregnancy was negative.  UA showed no evidence of infection.  There was trace hemoglobin but low suspicion for nephrolithiasis given clinical presentation.  When patient went to get x-rays that she told the x-ray tech that it was her left hip.  This was the hip that was imaged.  I reevaluated patient at which point she said that it is the entirety of her lower back and both hips.  Repeat hip x-rays on right were not completed since widespread pain is less likely to be related to trauma several months ago and more likely related to lower back pain.  X-rays of lumbar spine and left hip  were normal.  She was prescribed Naprosyn for pain relief with instruction to take additional NSAIDs with this medication.  She was also given a muscle relaxer (Robaxin) up to twice a day with instruction not to drive drink alcohol taking this medication.  She is to use heat, rest, stretch for symptom relief.  Discussed that if her symptoms  are not improving quickly with medication she should follow-up with sports medicine and was given contact information for local provider with instruction to call to schedule an appointment.  Discussed that if she has any worsening symptoms including increased pain, lower extremity weakness, bowel/bladder incontinence she needs to be seen immediately.  Strict return precautions given.  Work excuse note provided.  Final Clinical Impressions(s) / UC Diagnoses   Final diagnoses:  Acute bilateral low back pain with right-sided sciatica  Bilateral hip pain     Discharge Instructions      X-rays of your hip and back were normal.  Start Naprosyn twice daily for pain.  Do not take NSAIDs with this medication including aspirin, ibuprofen/Advil, naproxen/Aleve.  Use Robaxin for additional pain relief.  Do not drive or drink alcohol with this medication.  Use heat and gentle stretch for symptom relief.  If your symptoms do not improving quickly please follow-up with sports medicine; call to schedule an appointment.  If you have any worsening symptoms including increased pain, fever, going to the bathroom himself without noticing it, weakness in your legs, numbness or tingling sensation you need to go to the emergency room.     ED Prescriptions     Medication Sig Dispense Auth. Provider   naproxen (NAPROSYN) 500 MG tablet Take 1 tablet (500 mg total) by mouth 2 (two) times daily. 20 tablet Fredricka Kohrs K, PA-C   methocarbamol (ROBAXIN) 500 MG tablet Take 1 tablet (500 mg total) by mouth 2 (two) times daily. 20 tablet Jahmel Flannagan, Derry Skill, PA-C      PDMP not reviewed this encounter.   Terrilee Croak, PA-C 10/28/21 1336

## 2021-10-28 NOTE — ED Triage Notes (Signed)
Patient thinks right flank pain may be related to a fall out of bed.  Bed is described as very high.  Patient reports this is the side she landed on , but did not hurt like it is now.    Right flank, throbbing pain , intermittent.  Currently this pain is the worst that this pain has been.  Numbness intermittently in right flank and right leg.  No issues controlling bladder or bowel.    Patient has taken ibuprofen.

## 2021-10-28 NOTE — Discharge Instructions (Signed)
X-rays of your hip and back were normal.  Start Naprosyn twice daily for pain.  Do not take NSAIDs with this medication including aspirin, ibuprofen/Advil, naproxen/Aleve.  Use Robaxin for additional pain relief.  Do not drive or drink alcohol with this medication.  Use heat and gentle stretch for symptom relief.  If your symptoms do not improving quickly please follow-up with sports medicine; call to schedule an appointment.  If you have any worsening symptoms including increased pain, fever, going to the bathroom himself without noticing it, weakness in your legs, numbness or tingling sensation you need to go to the emergency room.

## 2021-11-08 ENCOUNTER — Ambulatory Visit (INDEPENDENT_AMBULATORY_CARE_PROVIDER_SITE_OTHER): Payer: Medicaid Other | Admitting: Family Medicine

## 2021-11-08 VITALS — BP 146/110 | Ht 66.0 in | Wt 218.0 lb

## 2021-11-08 DIAGNOSIS — M5416 Radiculopathy, lumbar region: Secondary | ICD-10-CM | POA: Diagnosis present

## 2021-11-08 MED ORDER — MELOXICAM 15 MG PO TABS: 15.0000 mg | ORAL_TABLET | Freq: Every day | ORAL | 2 refills | Status: DC

## 2021-11-08 NOTE — Patient Instructions (Signed)
You have lumbar radiculopathy (a pinched nerve in your low back). Ok to take tylenol for baseline pain relief (1-2 extra strength tabs 3x/day) Start Meloxicam daily with food for pain and inflammation - don't take naproxen/aleve, ibuprofen while on this. Ok to continue the methocarbamol as needed for muscle spasms if it's helping. Stay as active as possible. Physical therapy has been shown to be helpful as well - start this Strengthening of low back muscles, abdominal musculature are key for long term pain relief. If not improving, will consider further imaging (MRI). Follow up with me in 1 month.

## 2021-11-08 NOTE — Progress Notes (Signed)
    SUBJECTIVE:   CHIEF COMPLAINT / HPI:   Patient is a 36 year old female presenting with low back pain and bilateral leg pain. Patient reports symptoms started about 3-4 months ago. She reports she fell from her bed and landed on left side of the hip 4 months ago and this was the start of her pain. Describes pain as a sharp and tingling pain radiating to the knee bilaterally. Pain is constant but shooting pain is worse and 10/10 with positional changes and walking. She has been seen in the ED where lumbar spine and pelvic x-ray was negative. She has tried to see heart, naproxen and methocarbamol any relief. Denies any pelvic paresthesia or incontinence.  PERTINENT  PMH / PSH: Reviewed  OBJECTIVE:   BP (!) 146/110   Ht 5\' 6"  (1.676 m)   Wt 218 lb (98.9 kg)   BMI 35.19 kg/m      Physical Exam  General: Alert, well appearing, NAD   Back No gross deformity, scoliosis. TTP over the greater trochanter and gluteal musculature bilaterally. FROM. Strength LEs 5/5 all muscle groups.   Sensation intact to light touch bilaterally. Negative logroll, FABER or FADIR test. Negative straight leg raise.  ASSESSMENT/PLAN:   Patient's constellation of symptoms is most likely from radiculopathy vs spinal stenosis from discogenic pathology.  While she is tender on bilateral trochanters this would not explain her back pain or radiation down legs.  Discussed options.  Will avoid prednisone with history of bipolar.  Advised patient to use meloxicam daily and placed referral for physical therapy.  Consider MRI if not improving at f/u in 1 month.   Alen Bleacher, MD [place]

## 2021-11-09 ENCOUNTER — Encounter: Payer: Self-pay | Admitting: Family Medicine

## 2021-11-15 ENCOUNTER — Emergency Department (HOSPITAL_COMMUNITY): Payer: Medicaid Other

## 2021-11-15 ENCOUNTER — Encounter (HOSPITAL_COMMUNITY): Payer: Self-pay

## 2021-11-15 ENCOUNTER — Emergency Department (HOSPITAL_COMMUNITY)
Admission: EM | Admit: 2021-11-15 | Discharge: 2021-11-16 | Disposition: A | Discharge status: Home / Self Care | Payer: Medicaid Other | Attending: Emergency Medicine | Admitting: Emergency Medicine

## 2021-11-15 DIAGNOSIS — M5442 Lumbago with sciatica, left side: Secondary | ICD-10-CM | POA: Insufficient documentation

## 2021-11-15 DIAGNOSIS — M545 Low back pain, unspecified: Secondary | ICD-10-CM | POA: Diagnosis present

## 2021-11-15 DIAGNOSIS — N9489 Other specified conditions associated with female genital organs and menstrual cycle: Secondary | ICD-10-CM | POA: Diagnosis not present

## 2021-11-15 DIAGNOSIS — I10 Essential (primary) hypertension: Secondary | ICD-10-CM | POA: Diagnosis not present

## 2021-11-15 LAB — I-STAT BETA HCG BLOOD, ED (MC, WL, AP ONLY): I-stat hCG, quantitative: <5 m[IU]/mL (ref <5)

## 2021-11-15 MED ORDER — HYDROCODONE-ACETAMINOPHEN 5-325 MG PO TABS
1.0000 | ORAL_TABLET | Freq: Once | ORAL | Status: AC
  Administered 2021-11-15: 1 via ORAL
  Filled 2021-11-15: qty 1

## 2021-11-15 NOTE — ED Triage Notes (Signed)
Pt comes via [facility] for nerve pain in her L leg that has been going on for 2 weeks.

## 2021-11-15 NOTE — ED Provider Triage Note (Signed)
Emergency Medicine Provider Triage Evaluation Note  Donna Thomas , a 36 y.o. female  was evaluated in triage.  Pt complains of left leg painxseveral weeks. Reports difficulty getting to bathroom in time, but able to determine when she will urinate. No loss of bowel. Pain is a 10. Has taken naproxen and muscle relaxers w/o relief. Reports symptoms started after falling on L side several weeks ago.   Review of Systems  Positive: Back pain, urinary urgency Negative: No loss of bowel  Physical Exam  BP (!) 180/126   Pulse (!) 109   Temp 97.8 F (36.6 C) (Oral)   Resp 20   SpO2 100%  Gen:   Awake, tearful Resp:  Normal effort  MSK:   +SLR of L leg  Medical Decision Making  Medically screening exam initiated at 8:41 PM.  Appropriate orders placed.  Donna Thomas was informed that the remainder of the evaluation will be completed by another provider, this initial triage assessment does not replace that evaluation, and the importance of remaining in the ED until their evaluation is complete.    Osvaldo Shipper, [place] 11/15/21 2044

## 2021-11-16 ENCOUNTER — Emergency Department (HOSPITAL_COMMUNITY): Payer: Medicaid Other

## 2021-11-16 MED ORDER — ONDANSETRON 8 MG PO TBDP
8.0000 mg | ORAL_TABLET | Freq: Once | ORAL | Status: AC
  Administered 2021-11-16: 8 mg via ORAL
  Filled 2021-11-16: qty 1

## 2021-11-16 MED ORDER — OXYCODONE-ACETAMINOPHEN 10-325 MG PO TABS: 1.0000 | ORAL_TABLET | Freq: Four times a day (QID) | ORAL | 0 refills | Status: DC | PRN

## 2021-11-16 MED ORDER — ONDANSETRON 8 MG PO TBDP: 8.0000 mg | ORAL_TABLET | Freq: Three times a day (TID) | ORAL | 0 refills | Status: DC | PRN

## 2021-11-16 MED ORDER — HYDROMORPHONE HCL 2 MG/ML IJ SOLN
2.0000 mg | Freq: Once | INTRAMUSCULAR | Status: AC
  Administered 2021-11-16: 2 mg via INTRAMUSCULAR
  Filled 2021-11-16: qty 1

## 2021-11-16 NOTE — ED Provider Notes (Signed)
[facility]-EMERGENCY DEPT Provider Note: Donna Dell, MD, FACEP  CSN: [id number] MRN: [id number] ARRIVAL: 11/15/21 at 2002 ROOM: [facility]   CHIEF COMPLAINT  Back Pain   HISTORY OF PRESENT ILLNESS  11/16/21 2:27 AM Donna Thomas is a 36 y.o. female with several weeks of back pain radiating to her left leg.  The back pain is bilateral but worse on the left.  The pain on the left is primarily in about the L4 dermatome with intermittent paresthesias.  She was seen at an urgent care on 10/28/2021 and treated with naproxen and Robaxin.  She states these did not provide any relief.  Over the past several days the pain is worsened and she has become incontinent of urine.  She is also constipated.  She denies any saddle anesthesia.  She is able to ambulate.   Past Medical History:  Diagnosis Date   Bipolar I disorder, single manic episode (HCC)    Complication of anesthesia    middle sister had malignant hyperthermia   GERD (gastroesophageal reflux disease)    Headache(784.0)    History of depression    History of HPV infection    HSV infection    Hx of abnormal cervical Pap smear    ASCUS   Hypertension    Infection    Malignant hyperthermia    family history:  middle sister   Pregnancy induced hypertension    Vaginal delivery 2008, 2014, 2017    Past Surgical History:  Procedure Laterality Date   LAPAROSCOPIC TUBAL LIGATION Bilateral 04/07/2015   Procedure: LAPAROSCOPIC BILATERAL TUBAL LIGATION WITH FILSHIE CLIPS;  Surgeon: Tereso Newcomer, MD;  Location: [facility];  Service: Gynecology;  Laterality: Bilateral;    Family History  Problem Relation Age of Onset   Diabetes Maternal Grandmother    Cancer Maternal Grandmother     Social History   Tobacco Use   Smoking status: Never   Smokeless tobacco: Never  Vaping Use   Vaping Use: Never used  Substance Use Topics   Alcohol use: Yes   Drug use: Yes    Types: Marijuana    Prior to Admission medications    Medication Sig Start Date End Date Taking? Authorizing Provider  docusate sodium (COLACE) 100 MG capsule Take 1 capsule (100 mg total) by mouth 2 (two) times daily as needed. 04/07/15   Anyanwu, Jethro Bastos, MD  meloxicam (MOBIC) 15 MG tablet Take 1 tablet (15 mg total) by mouth daily. 11/08/21   Hudnall, Azucena Fallen, MD  methocarbamol (ROBAXIN) 500 MG tablet Take 1 tablet (500 mg total) by mouth 2 (two) times daily. 10/28/21   Raspet, Noberto Retort, PA-C  oxyCODONE-acetaminophen (PERCOCET/ROXICET) 5-325 MG tablet Take 1-2 tablets by mouth every 6 (six) hours as needed. Patient not taking: Reported on 10/28/2021 04/07/15   Tereso Newcomer, MD  valACYclovir (VALTREX) 500 MG tablet Take 500 mg by mouth 2 (two) times daily.    [provider]  valACYclovir (VALTREX) 500 MG tablet TAKE 1 TABLET BY MOUTH TWICE A DAY 01/07/16   Anyanwu, Jethro Bastos, MD  Vitamin D, Ergocalciferol, (DRISDOL) 50000 UNITS CAPS capsule Take 1 capsule (50,000 Units total) by mouth every 7 (seven) days. 10/15/14   Lesly Dukes, MD    Allergies Patient has no known allergies.   REVIEW OF SYSTEMS  Negative except as noted here or in the History of Present Illness.   PHYSICAL EXAMINATION  Initial Vital Signs Blood pressure (!) 180/126, pulse (!) 109, temperature 97.8 F (  36.6 C), temperature source Oral, resp. rate 20, SpO2 100 %, unknown if currently breastfeeding.  Examination General: Well-developed, well-nourished female in no acute distress; appearance consistent with age of record HENT: normocephalic; atraumatic Eyes: Normal appearance Neck: supple Heart: regular rate and rhythm Lungs: clear to auscultation bilaterally Abdomen: soft; nondistended; nontender; bowel sounds present Back: Left lumbar tenderness with pain on movement of left hip Extremities: No deformity; full range of motion; pulses normal Neurologic: Awake, alert and oriented; motor function intact in all extremities and symmetric but exam limited  by pain on movement of left hip; no facial droop Skin: Warm and dry Psychiatric: Normal mood and affect   RESULTS  Summary of this visit's results, reviewed and interpreted by myself:   EKG Interpretation  Date/Time:    Ventricular Rate:    PR Interval:    QRS Duration:   QT Interval:    QTC Calculation:   R Axis:     Text Interpretation:         Laboratory Studies: Results for orders placed or performed during the hospital encounter of 11/15/21 (from the past 24 hour(s))  I-Stat Beta hCG blood, ED ([facility], [facility], [facility] only)     Status: None   Collection Time: 11/15/21  9:08 PM  Result Value Ref Range   I-stat hCG, quantitative <5.0 <5 mIU/mL   Comment 3           Imaging Studies: MR LUMBAR SPINE WO CONTRAST  Result Date: 11/16/2021 CLINICAL DATA:  Ongoing low back pain with difficulty ambulating. Rule out cauda equina syndrome. EXAM: MRI LUMBAR SPINE WITHOUT CONTRAST TECHNIQUE: Multiplanar, multisequence MR imaging of the lumbar spine was performed. No intravenous contrast was administered. COMPARISON:  Radiography 10/28/2021 FINDINGS: Segmentation:  Standard. Alignment:  Physiologic. Vertebrae:  No fracture, evidence of discitis, or bone lesion. Conus medullaris and cauda equina: Conus extends to the L2 level. Conus and cauda equina appear normal. Paraspinal and other soft tissues: Innumerable gallbladder calculi. Disc levels: Degenerative facet spurring at L4-5 and L5-S1. Minor disc desiccation and narrowing at L5-S1 where there is a small posterior annular fissure. Diffusely patent canal and foramina. IMPRESSION: 1. No acute finding.  Widely patent spinal canal. 2. Lower lumbar facet osteoarthritis. 3. Numerous gallstones. Electronically Signed   By: Jorje Guild M.D.   On: 11/16/2021 06:32    ED COURSE and MDM  Nursing notes, initial and subsequent vitals signs, including pulse oximetry, reviewed and interpreted by myself.  Vitals:   11/15/21 2037 11/16/21 0305 11/16/21 0631   BP: (!) 180/126 (!) 171/116 (!) 172/98  Pulse: (!) 109 (!) 104 82  Resp: 20 19 17   Temp: 97.8 F (36.6 C)  (!) 97.5 F (36.4 C)  TempSrc: Oral  Oral  SpO2: 100% 100% 95%   Medications  HYDROcodone-acetaminophen (NORCO/VICODIN) 5-325 MG per tablet 1 tablet (1 tablet Oral Given 11/15/21 2056)  ondansetron (ZOFRAN-ODT) disintegrating tablet 8 mg (8 mg Oral Given 11/16/21 0308)  HYDROmorphone (DILAUDID) injection 2 mg (2 mg Intramuscular Given 11/16/21 0309)  ondansetron (ZOFRAN-ODT) disintegrating tablet 8 mg (8 mg Oral Given 11/16/21 0636)   The patient's urinary incontinence is concerning for cauda equina syndrome and will obtain an MRI of the lumbar region later this morning.  6:37 AM No acute abnormality seen on MRI.  There are some minor degenerative changes noted.  We will treat her with a short course of narcotic pain medication and refer to neurosurgery.  Her perceived urinary incontinence may be due to her not  wanting to move due to pain and thus allowing her bladder to fill to capacity.   PROCEDURES  Procedures   ED DIAGNOSES     ICD-10-CM   1. Acute bilateral low back pain with left-sided sciatica  M54.42          Keanna Tugwell, Jonny Ruiz, MD 11/16/21 [phone]

## 2021-11-16 NOTE — ED Notes (Signed)
Gone to MRI 

## 2021-11-16 NOTE — ED Notes (Signed)
Patient transported to MRI 

## 2021-12-06 ENCOUNTER — Ambulatory Visit (INDEPENDENT_AMBULATORY_CARE_PROVIDER_SITE_OTHER): Payer: Medicaid Other | Admitting: Family Medicine

## 2021-12-06 ENCOUNTER — Encounter: Payer: Self-pay | Admitting: Family Medicine

## 2021-12-06 VITALS — BP 156/110 | Ht 66.0 in | Wt 218.0 lb

## 2021-12-06 DIAGNOSIS — M5416 Radiculopathy, lumbar region: Secondary | ICD-10-CM

## 2021-12-06 NOTE — Patient Instructions (Signed)
You have lumbar radiculopathy (a pinched nerve in your low back). The physical therapy is very important - we are checking what is going on with this Ok to take tylenol for baseline pain relief (1-2 extra strength tabs 3x/day) Take Meloxicam daily with food for pain and inflammation - don't take naproxen/aleve, ibuprofen while on this. Ok to continue the methocarbamol as needed for muscle spasms if it's helping. Stay as active as possible. Strengthening of low back muscles, abdominal musculature are key for long term pain relief. If not improving, will consider further imaging (MRI). Follow up with me in 1 month.

## 2021-12-06 NOTE — Progress Notes (Signed)
  SUBJECTIVE:   CHIEF COMPLAINT / HPI:   36 year old female presenting for follow-up of low back pain and bilateral leg pain.  10/23 Patient reports symptoms started about 3-4 months ago. She reports she fell from her bed and landed on left side of the hip 4 months ago and this was the start of her pain. Describes pain as a sharp and tingling pain radiating to the knee bilaterally. Pain is constant but shooting pain is worse and 10/10 with positional changes and walking. She has been seen in the ED where lumbar spine and pelvic x-ray was negative. She has tried to see heart, naproxen and methocarbamol any relief. Denies any pelvic paresthesia or incontinence.  11/20 Patient notes that most of her pain now is in her right lower back.  She has been doing some walking with her family but has not heard about physical therapy.  She thought that her physical therapy appointment was today in this office.  She has been taking meloxicam daily.  She is hoping a refill of the medication that she got her ED visit on 11/15/2021.  No bowel/bladder dysfunction.  Still with radiation into extremities.  PERTINENT  PMH / PSH: Reviewed  OBJECTIVE:  BP (!) 156/110   Ht 5\' 6"  (1.676 m)   Wt 218 lb (98.9 kg)   BMI 35.19 kg/m  Back: No gross deformity, scoliosis. TTP over right SI joint.  No midline or bony TTP. FROM. Strength LEs 5/5 all muscle groups.   Negative SLRs. Sensation intact to light touch bilaterally.   ASSESSMENT/PLAN:  Low back pain: Upon chart review, MRI lumbar spine during ED visit on 11/15/2021 shows no acute finding with widely patent spinal canal.  Evidence of lower lumbar facet osteoarthritis at L4-5 and L5-S1.  Patient would greatly benefit from PT and this is being checked on as a referral shows "under review".  May continue taking meloxicam for pain.  I have declined Percocet refill.  11/17/2021, DO 12/06/2021, 11:19 AM PGY-2, [facility]

## 2021-12-15 NOTE — Therapy (Signed)
OUTPATIENT PHYSICAL THERAPY EVALUATION   Patient Name: Donna Thomas MRN: [id number] DOB:Oct 03, 1985, 36 y.o., female Today's Date: 12/16/2021   END OF SESSION:  PT End of Session - 12/16/21 1112     Visit Number 1    Number of Visits 13    Date for PT Re-Evaluation 01/27/22    Authorization Type [employer]    PT Start Time 1045    PT Stop Time 1130    PT Time Calculation (min) 45 min    Activity Tolerance Patient limited by pain    Behavior During Therapy [facility] for tasks assessed/performed             Past Medical History:  Diagnosis Date   Bipolar I disorder, single manic episode (HCC)    Complication of anesthesia    middle sister had malignant hyperthermia   GERD (gastroesophageal reflux disease)    Headache(784.0)    History of depression    History of HPV infection    HSV infection    Hx of abnormal cervical Pap smear    ASCUS   Hypertension    Infection    Malignant hyperthermia    family history:  middle sister   Pregnancy induced hypertension    Vaginal delivery 2008, 2014, 2017   Past Surgical History:  Procedure Laterality Date   LAPAROSCOPIC TUBAL LIGATION Bilateral 04/07/2015   Procedure: LAPAROSCOPIC BILATERAL TUBAL LIGATION WITH FILSHIE CLIPS;  Surgeon: Tereso Newcomer, MD;  Location: [facility];  Service: Gynecology;  Laterality: Bilateral;   Patient Active Problem List   Diagnosis Date Noted   Encounter for female sterilization procedure 04/07/2015   Essential hypertension 09/29/2014   Obesity 09/01/2014   ASCUS with positive high risk HPV 05/31/2012   Clinical depression 04/26/2011   Avitaminosis D 12/02/2010   Bipolar I disorder, single manic episode (HCC) 04/26/2010   Genital herpes 04/26/2010    [facility]  REFERRING PROVIDER: Lenda Kelp, MD  REFERRING DIAG: Radiculopathy of lumbar region  Rationale for Evaluation and Treatment: Rehabilitation  THERAPY DIAG:  Other low back pain  Pain in left  leg  Muscle weakness (generalized)  ONSET DATE: October 2023   SUBJECTIVE:       SUBJECTIVE STATEMENT: Patient reports she fell in October 2023 and since that time she has been having pain in the left leg. Pain is really bad at night. Pain feels like a throbbing. Sitting, lying on back, and walking will aggravate the pain. Limited to about 10 minutes with walking. She also notes that since this started, when she feels like she has to go to the bathroom she has to go immediately.   PERTINENT HISTORY:  History of bipolar, depression, HTN  PAIN:  Are you having pain? Yes:  NPRS scale: 4/10 Pain location: Low back, left leg Pain description: Throbbing Aggravating factors: Sitting, lying on back, and walking Relieving factors: Medication  PRECAUTIONS: Fall  WEIGHT BEARING RESTRICTIONS: No  FALLS:  Has patient fallen in last 6 months? Yes. Number of falls 2 - states she fell off her bed  PLOF: Independent  PATIENT GOALS: Get rid of pan   OBJECTIVE:  DIAGNOSTIC FINDINGS:  Lumbar MRI 11/16/2021 IMPRESSION: 1. No acute finding.  Widely patent spinal canal. 2. Lower lumbar facet osteoarthritis. 3. Numerous gallstones.  PATIENT SURVEYS:  Modified Oswestry 60% disability   SCREENING FOR RED FLAGS: Negative  COGNITION: Overall cognitive status: Within functional limits for tasks assessed     SENSATION: [facility]  MUSCLE LENGTH: Not assessed due to pain  POSTURE:  Patient with slight forward trunk lean and remains in relative hip flexion, weight shift toward right  PALPATION: Not assessed  LUMBAR ROM:   AROM eval  Flexion 25% - fingertips to distal thigh  Extension 25%  Right lateral flexion 75%  Left lateral flexion 50%  Right rotation 75%  Left rotation 50%   (Blank rows = not tested)  LOWER EXTREMITY ROM:      Not assessed at eval due to pain and inability to be in supine positioning   Right eval Left eval  Hip flexion    Hip extension    Hip  abduction    Hip adduction    Hip internal rotation    Hip external rotation    Knee flexion    Knee extension    Ankle dorsiflexion    Ankle plantarflexion    Ankle inversion    Ankle eversion     (Blank rows = not tested)  LOWER EXTREMITY MMT:    MMT Right eval Left eval  Hip flexion 4- 3  Hip extension    Hip abduction    Hip adduction    Hip internal rotation    Hip external rotation    Knee flexion 4 4-  Knee extension 5 4  Ankle dorsiflexion 5 5  Ankle plantarflexion    Ankle inversion    Ankle eversion     (Blank rows = not tested)  LUMBAR SPECIAL TESTS:  Slump positive on left  FUNCTIONAL TESTS:  Not assessed  GAIT: Assistive device utilized: None Level of assistance: Complete Independence Comments: Antalgic on left   TODAY'S TREATMENT:     [facility] Adult PT Treatment:                                                DATE: 12/16/2021 Therapeutic Exercise: Seated sciatic nerve glide with foot on step-stoolx 10 Seated piriformis pull/push stretch 3 x 15 sec each Standing lumbar extension with forearms on wall x 10 Standing hip extension x 10  PATIENT EDUCATION:  Education details: Exam findings, POC, HEP Person educated: Patient Education method: Explanation, Demonstration, Tactile cues, Verbal cues, and Handouts Education comprehension: verbalized understanding, returned demonstration, verbal cues required, tactile cues required, and needs further education  HOME EXERCISE PROGRAM: Access Code: [email or web address]    ASSESSMENT: CLINICAL IMPRESSION: Patient is a 36 y.o. female who was seen today for physical therapy evaluation and treatment for chronic low back and left leg pain. Evaluation limited this visit due to patient's pain and she was unable to tolerate being in a supine or prone positioning for assessment. She does exhibit radicular symptoms on the left despite a relatively benign MRI. She exhibits limitation with lumbar motion but did seem to tolerate  repeated extension so provided lumbar extension supported using wall. Difficulty to test her strength due to inability to perform in a comfortable position, but she does seem to exhibit greater strength deficit on left side.   OBJECTIVE IMPAIRMENTS: Abnormal gait, decreased activity tolerance, decreased ROM, decreased strength, impaired flexibility, postural dysfunction, and pain.   ACTIVITY LIMITATIONS: carrying, lifting, bending, sitting, standing, squatting, sleeping, bed mobility, and locomotion level  PARTICIPATION LIMITATIONS: meal prep, cleaning, laundry, shopping, community activity, and occupation  PERSONAL FACTORS: Fitness, Past/current experiences, Time since onset of injury/illness/exacerbation, and 3+ comorbidities: see PMH  above  are also affecting patient's functional outcome.   REHAB POTENTIAL: Good  CLINICAL DECISION MAKING: Stable/uncomplicated  EVALUATION COMPLEXITY: Low   GOALS: Goals reviewed with patient? Yes  SHORT TERM GOALS: Target date: 01/06/2022  Patient will be I with initial HEP in order to progress with therapy. Baseline: HEP provided at eval Goal status: INITIAL  2.  Patient will report low back and left leg pain </= 2/10 in order to improve ability to tolerate supine and prone positioning to improve sleeping ability Baseline: 4/10 pain at rest that increases with movement and positioning Goal status: INITIAL  LONG TERM GOALS: Target date: 01/27/2022  Patient will be I with final HEP to maintain progress from PT. Baseline: HEP provided at eval Goal status: INITIAL  2.  Patient will report Modified ODI </= 40% disability in order to indicate improved functional ability. Baseline: 60% disability Goal status: INITIAL  3.  Patient will demonstrate a 50% improvement in her lumbar flexion/extension ROM in order to improve bed mobility and performing household tasks Baseline: grossly 25% of normal motion Goal status: INITIAL  4.  Patient will  report no limitations with walking due to pain in order to improve her community access and and grocery shopping ability Baseline: 10 minutes Goal status: INITIAL   PLAN: PT FREQUENCY: 1x/week  PT DURATION: 6 weeks  PLANNED INTERVENTIONS: Therapeutic exercises, Therapeutic activity, Neuromuscular re-education, Balance training, Gait training, Patient/Family education, Self Care, Joint mobilization, Joint manipulation, Aquatic Therapy, Dry Needling, Electrical stimulation, Spinal manipulation, Spinal mobilization, Cryotherapy, Moist heat, Traction, Manual therapy, and Re-evaluation.  PLAN FOR NEXT SESSION: Review HEP and progress PRN, continue with sciatic nerve mobilizations, extension based movements if beneficial, assess and progress strengthening as tolerated   Rosana Hoes, PT, DPT, LAT, ATC 12/16/21  1:41 PM Phone: [phone] Fax: [phone]   Check all possible CPT codes: 53614 - PT Re-evaluation, 97110- Therapeutic Exercise, [phone]- Neuro Re-education, [phone] - Gait Training, [phone] - Manual Therapy, 97530 - Therapeutic Activities, 97535 - Self Care, [phone] - Mechanical traction, 97014 - Electrical stimulation (unattended), and [id number] - Aquatic therapy    Check all conditions that are expected to impact treatment: Morbid obesity and Musculoskeletal disorders   If treatment provided at initial evaluation, no treatment charged due to lack of authorization.

## 2021-12-16 ENCOUNTER — Encounter: Payer: Self-pay | Admitting: Physical Therapy

## 2021-12-16 ENCOUNTER — Other Ambulatory Visit: Payer: Self-pay

## 2021-12-16 ENCOUNTER — Ambulatory Visit: Payer: Medicaid Other | Attending: Family Medicine | Admitting: Physical Therapy

## 2021-12-16 DIAGNOSIS — M5416 Radiculopathy, lumbar region: Secondary | ICD-10-CM | POA: Insufficient documentation

## 2021-12-16 DIAGNOSIS — M5459 Other low back pain: Secondary | ICD-10-CM | POA: Diagnosis not present

## 2021-12-16 DIAGNOSIS — M79605 Pain in left leg: Secondary | ICD-10-CM | POA: Insufficient documentation

## 2021-12-16 DIAGNOSIS — M6281 Muscle weakness (generalized): Secondary | ICD-10-CM | POA: Insufficient documentation

## 2021-12-16 NOTE — Patient Instructions (Signed)
Access Code: [email or web address] URL: [email or web address] Date: 12/16/2021 Prepared by: Rosana Hoes  Exercises - Standing Distal Sciatic Nerve Mobilization on Step  - 1-2 x daily - 2 sets - 10 reps - Seated Piriformis Stretch  - 1-2 x daily - 3 reps - 15 seconds hold - Seated Hip Stretch  - 1-2 x daily - 3 reps - 15 seconds hold - Standing Lumbar Extension at Wall - Forearms  - 1-2 x daily - 2 sets - 10 reps - Standing Hip Extension with Chair  - 1 x daily - 7 x weekly - 2 sets - 10 reps

## 2021-12-29 NOTE — Therapy (Signed)
OUTPATIENT PHYSICAL THERAPY TREATMENT NOTE   Patient Name: Donna Thomas MRN: [id number] DOB:05-04-1985, 36 y.o., female Today's Date: 12/30/2021  PCP: [facility], [facility]   REFERRING PROVIDER: Lenda Kelp, MD   END OF SESSION:   PT End of Session - 12/30/21 1034     Visit Number 2    Number of Visits 13    Date for PT Re-Evaluation 01/27/22    Authorization Type [employer]    Authorization - Number of Visits 12    PT Start Time 1045    PT Stop Time 1125    PT Time Calculation (min) 40 min    Activity Tolerance Patient limited by pain    Behavior During Therapy [facility] for tasks assessed/performed             Past Medical History:  Diagnosis Date   Bipolar I disorder, single manic episode (HCC)    Complication of anesthesia    middle sister had malignant hyperthermia   GERD (gastroesophageal reflux disease)    Headache(784.0)    History of depression    History of HPV infection    HSV infection    Hx of abnormal cervical Pap smear    ASCUS   Hypertension    Infection    Malignant hyperthermia    family history:  middle sister   Pregnancy induced hypertension    Vaginal delivery 2008, 2014, 2017   Past Surgical History:  Procedure Laterality Date   LAPAROSCOPIC TUBAL LIGATION Bilateral 04/07/2015   Procedure: LAPAROSCOPIC BILATERAL TUBAL LIGATION WITH FILSHIE CLIPS;  Surgeon: Tereso Newcomer, MD;  Location: [facility];  Service: Gynecology;  Laterality: Bilateral;   Patient Active Problem List   Diagnosis Date Noted   Encounter for female sterilization procedure 04/07/2015   Essential hypertension 09/29/2014   Obesity 09/01/2014   ASCUS with positive high risk HPV 05/31/2012   Clinical depression 04/26/2011   Avitaminosis D 12/02/2010   Bipolar I disorder, single manic episode (HCC) 04/26/2010   Genital herpes 04/26/2010    REFERRING DIAG: Radiculopathy of lumbar region   THERAPY DIAG:  Other low back pain  Pain in left leg  Muscle  weakness (generalized)  Rationale for Evaluation and Treatment Rehabilitation  PERTINENT HISTORY: History of bipolar, depression, HTN   PRECAUTIONS: Fall    SUBJECTIVE:         SUBJECTIVE STATEMENT: Patient reports she is feeling better, she has been doing a lot of walking and working on the exercises. Most of the pain is in the lower back.  PAIN:  Are you having pain? Yes:  NPRS scale: 2/10 Pain location: Low back, left Pain description: Throbbing Aggravating factors: Sitting, lying on back, and walking Relieving factors: Medication   OBJECTIVE: (objective measures completed at initial evaluation unless otherwise dated) PATIENT SURVEYS:  Modified Oswestry 60% disability    MUSCLE LENGTH: Not assessed due to pain   POSTURE:  Patient with slight forward trunk lean and remains in relative hip flexion, weight shift toward right   PALPATION: Not assessed   LUMBAR ROM:    AROM eval  Flexion 25% - fingertips to distal thigh  Extension 25%  Right lateral flexion 75%  Left lateral flexion 50%  Right rotation 75%  Left rotation 50%   (Blank rows = not tested)   LOWER EXTREMITY ROM:                Not assessed at eval due to pain and inability to be in supine  positioning     12/30/2021: LE ROM grossly WFL   LOWER EXTREMITY MMT:     MMT Right eval Left eval Left 12/30/21  Hip flexion 4- 3 4-  Hip extension       Hip abduction       Hip adduction       Hip internal rotation       Hip external rotation       Knee flexion 4 4- 4  Knee extension 5 4 4+  Ankle dorsiflexion 5 5   Ankle plantarflexion       Ankle inversion       Ankle eversion        (Blank rows = not tested)   LUMBAR SPECIAL TESTS:  Slump positive on left   FUNCTIONAL TESTS:  Not assessed   GAIT: Assistive device utilized: None Level of assistance: Complete Independence Comments: Antalgic on left     TODAY'S TREATMENT:     [facility] Adult PT Treatment:                                                 DATE: 12/30/2021 Therapeutic Exercise: LTR x 10 - bolster under trunk SLR 2 x 10 each with focus on abdominal engagement - bolster under trunk Hooklying clamshell with red 2 x 20 Seated piriformis pull/push stretch 2 x 20 sec each Seated hamstring stretch with foot on step-stool 2 x 20 sec Standing lumbar extension with forearms on wall x 10 Standing hip extension 2 x 10 Manual: LAD for left LE 3 x 1 min bouts with gentle oscillations - patient positioned supine with bolster under trunk   [facility] Adult PT Treatment:                                                DATE: 12/16/2021 Therapeutic Exercise: Seated sciatic nerve glide with foot on step-stoolx 10 Seated piriformis pull/push stretch 3 x 15 sec each Standing lumbar extension with forearms on wall x 10 Standing hip extension x 10   PATIENT EDUCATION:  Education details: HEP update Person educated: Patient Education method: Explanation, Demonstration, Tactile cues, Verbal cues, and Handouts Education comprehension: verbalized understanding, returned demonstration, verbal cues required, tactile cues required, and needs further education   HOME EXERCISE PROGRAM: Access Code: [email or web address]      ASSESSMENT: CLINICAL IMPRESSION: Patient tolerated therapy well with no adverse effects. Therapy seems to be doing much better and reporting no radicular symptoms this visit. Therapy focused on stretching for the lumbar and hip region, and gradually progressing core and hip strengthening with good tolerance. She did report difficulty getting in a supine position so used a bolster under her trunk with better tolerance. Updated her HEP to progress stretching and strengthening exercises for home. Patient would benefit from continued skilled PT to progress her mobility and strength in order to reduce pain and maximize functional ability.     OBJECTIVE IMPAIRMENTS: Abnormal gait, decreased activity tolerance, decreased ROM, decreased strength,  impaired flexibility, postural dysfunction, and pain.    ACTIVITY LIMITATIONS: carrying, lifting, bending, sitting, standing, squatting, sleeping, bed mobility, and locomotion level   PARTICIPATION LIMITATIONS: meal prep, cleaning, laundry, shopping, community activity, and occupation   PERSONAL FACTORS: Fitness,  Past/current experiences, Time since onset of injury/illness/exacerbation, and 3+ comorbidities: see PMH above  are also affecting patient's functional outcome.      GOALS: Goals reviewed with patient? Yes   SHORT TERM GOALS: Target date: 01/06/2022   Patient will be I with initial HEP in order to progress with therapy. Baseline: HEP provided at eval Goal status: INITIAL   2.  Patient will report low back and left leg pain </= 2/10 in order to improve ability to tolerate supine and prone positioning to improve sleeping ability Baseline: 4/10 pain at rest that increases with movement and positioning Goal status: INITIAL   LONG TERM GOALS: Target date: 01/27/2022   Patient will be I with final HEP to maintain progress from PT. Baseline: HEP provided at eval Goal status: INITIAL   2.  Patient will report Modified ODI </= 40% disability in order to indicate improved functional ability. Baseline: 60% disability Goal status: INITIAL   3.  Patient will demonstrate a 50% improvement in her lumbar flexion/extension ROM in order to improve bed mobility and performing household tasks Baseline: grossly 25% of normal motion Goal status: INITIAL   4.  Patient will report no limitations with walking due to pain in order to improve her community access and and grocery shopping ability Baseline: 10 minutes Goal status: INITIAL     PLAN: PT FREQUENCY: 1x/week   PT DURATION: 6 weeks   PLANNED INTERVENTIONS: Therapeutic exercises, Therapeutic activity, Neuromuscular re-education, Balance training, Gait training, Patient/Family education, Self Care, Joint mobilization, Joint  manipulation, Aquatic Therapy, Dry Needling, Electrical stimulation, Spinal manipulation, Spinal mobilization, Cryotherapy, Moist heat, Traction, Manual therapy, and Re-evaluation.   PLAN FOR NEXT SESSION: Review HEP and progress PRN, continue with sciatic nerve mobilizations, extension based movements if beneficial, assess and progress strengthening as tolerated   Rosana Hoes, PT, DPT, LAT, ATC 12/30/21  11:34 AM Phone: [phone] Fax: [phone]

## 2021-12-30 ENCOUNTER — Other Ambulatory Visit: Payer: Self-pay

## 2021-12-30 ENCOUNTER — Encounter: Payer: Self-pay | Admitting: Physical Therapy

## 2021-12-30 ENCOUNTER — Ambulatory Visit: Payer: Medicaid Other | Attending: Family Medicine | Admitting: Physical Therapy

## 2021-12-30 DIAGNOSIS — M5459 Other low back pain: Secondary | ICD-10-CM | POA: Insufficient documentation

## 2021-12-30 DIAGNOSIS — M79605 Pain in left leg: Secondary | ICD-10-CM | POA: Insufficient documentation

## 2021-12-30 DIAGNOSIS — M6281 Muscle weakness (generalized): Secondary | ICD-10-CM | POA: Insufficient documentation

## 2021-12-30 NOTE — Patient Instructions (Signed)
Access Code: [email or web address] URL: [email or web address] Date: 12/30/2021 Prepared by: Rosana Hoes  Exercises - Standing Distal Sciatic Nerve Mobilization on Step  - 1-2 x daily - 2 sets - 10 reps - Seated Piriformis Stretch  - 1-2 x daily - 3 reps - 15 seconds hold - Seated Hip Stretch  - 1-2 x daily - 3 reps - 15 seconds hold - Standing Lumbar Extension at Wall - Forearms  - 1-2 x daily - 2 sets - 10 reps - Standing Hip Extension with Chair  - 1 x daily - 7 x weekly - 2 sets - 10 reps - Supine Lower Trunk Rotation  - 1 x daily - 10 reps - 5 seconds hold - Straight Leg Raise  - 1 x daily - 2 sets - 10 reps - Hooklying Clamshell with Resistance  - 1 x daily - 2 sets - 20 reps

## 2022-01-05 ENCOUNTER — Ambulatory Visit: Payer: Medicaid Other | Admitting: Physical Therapy

## 2022-01-05 ENCOUNTER — Encounter: Payer: Self-pay | Admitting: Physical Therapy

## 2022-01-05 ENCOUNTER — Other Ambulatory Visit: Payer: Self-pay

## 2022-01-05 DIAGNOSIS — M5459 Other low back pain: Secondary | ICD-10-CM

## 2022-01-05 DIAGNOSIS — M6281 Muscle weakness (generalized): Secondary | ICD-10-CM

## 2022-01-05 DIAGNOSIS — M79605 Pain in left leg: Secondary | ICD-10-CM

## 2022-01-05 NOTE — Therapy (Signed)
OUTPATIENT PHYSICAL THERAPY TREATMENT NOTE   Patient Name: Donna Thomas MRN: [id number] DOB:[birth year], 36 y.o., female Today's Date: 01/05/2022  PCP: [facility]   REFERRING PROVIDER: Dene Gentry, MD   END OF SESSION:   PT End of Session - 01/05/22 0936     Visit Number 3    Number of Visits 13    Date for PT Re-Evaluation 01/27/22    Authorization Type [employer]    Authorization - Number of Visits 12    PT Start Time 0930    PT Stop Time 1015    PT Time Calculation (min) 45 min    Activity Tolerance Patient tolerated treatment well    Behavior During Therapy WFL for tasks assessed/performed              Past Medical History:  Diagnosis Date   Bipolar I disorder, single manic episode ([place])    Complication of anesthesia    middle sister had malignant hyperthermia   GERD (gastroesophageal reflux disease)    Headache(784.0)    History of depression    History of HPV infection    HSV infection    Hx of abnormal cervical Pap smear    ASCUS   Hypertension    Infection    Malignant hyperthermia    family history:  middle sister   Pregnancy induced hypertension    Vaginal delivery 2008, 2014, 2017   Past Surgical History:  Procedure Laterality Date   LAPAROSCOPIC TUBAL LIGATION Bilateral 04/07/2015   Procedure: LAPAROSCOPIC BILATERAL TUBAL LIGATION WITH FILSHIE CLIPS;  Surgeon: Osborne Oman, MD;  Location: [facility];  Service: Gynecology;  Laterality: Bilateral;   Patient Active Problem List   Diagnosis Date Noted   Encounter for female sterilization procedure 04/07/2015   Essential hypertension 09/29/2014   Obesity 09/01/2014   ASCUS with positive high risk HPV 05/31/2012   Clinical depression 04/26/2011   Avitaminosis D 12/02/2010   Bipolar I disorder, single manic episode ([place]) 04/26/2010   Genital herpes 04/26/2010    REFERRING DIAG: Radiculopathy of lumbar region   THERAPY DIAG:  Other low back pain  Pain in left  leg  Muscle weakness (generalized)  Rationale for Evaluation and Treatment Rehabilitation  PERTINENT HISTORY: History of bipolar, depression, HTN   PRECAUTIONS: Fall    SUBJECTIVE:         SUBJECTIVE STATEMENT: Patient reports she had an injection yesterday, and the doctor told her she can still do therapy but she can't do anything laying down.   PAIN:  Are you having pain?  NPRS scale: 0/10 Pain location: Low back, left Pain description: Throbbing Aggravating factors: Sitting, lying on back, and walking Relieving factors: Medication   OBJECTIVE: (objective measures completed at initial evaluation unless otherwise dated) PATIENT SURVEYS:  Modified Oswestry 60% disability    MUSCLE LENGTH: Not assessed due to pain   POSTURE:  Patient with slight forward trunk lean and remains in relative hip flexion, weight shift toward right   PALPATION: Not assessed   LUMBAR ROM:    AROM eval  Flexion 25% - fingertips to distal thigh  Extension 25%  Right lateral flexion 75%  Left lateral flexion 50%  Right rotation 75%  Left rotation 50%   (Blank rows = not tested)   LOWER EXTREMITY ROM:                Not assessed at eval due to pain and inability to be in supine positioning  12/30/2021: LE ROM grossly WFL   LOWER EXTREMITY MMT:     MMT Right eval Left eval Left 12/30/21  Hip flexion 4- 3 4-  Hip extension       Hip abduction       Hip adduction       Hip internal rotation       Hip external rotation       Knee flexion 4 4- 4  Knee extension 5 4 4+  Ankle dorsiflexion 5 5   Ankle plantarflexion       Ankle inversion       Ankle eversion        (Blank rows = not tested)   LUMBAR SPECIAL TESTS:  Slump positive on left   FUNCTIONAL TESTS:  Not assessed   GAIT: Assistive device utilized: None Level of assistance: Complete Independence Comments: Antalgic on left     TODAY'S TREATMENT:     [facility] Adult PT Treatment:                                                 DATE: 01/05/2022 Therapeutic Exercise: Seated piriformis pull/push stretch 2 x 20 sec each Seated hamstring stretch with foot on step-stool 2 x 20 sec Seated clamshell with green 2 x 20 Seated forward abdominal set with physioball 2 x 10 with 5 sec hold Seated lateral abdominal set with physioball 2 x 10 with 5 sec hold Standing hip extension with green at knees 2 x 10 Standing hip abduction with green at knees 2 x 10 Standing lumbar extension with forearms on wall 2 x 10 Standing pilates springboard bar pull down with yell 2 x 10   [facility] Adult PT Treatment:                                                DATE: 12/30/2021 Therapeutic Exercise: LTR x 10 - bolster under trunk SLR 2 x 10 each with focus on abdominal engagement - bolster under trunk Hooklying clamshell with red 2 x 20 Seated piriformis pull/push stretch 2 x 20 sec each Seated hamstring stretch with foot on step-stool 2 x 20 sec Standing lumbar extension with forearms on wall x 10 Standing hip extension 2 x 10 Manual: LAD for left LE 3 x 1 min bouts with gentle oscillations - patient positioned supine with bolster under trunk  [facility] Adult PT Treatment:                                                DATE: 12/16/2021 Therapeutic Exercise: Seated sciatic nerve glide with foot on step-stoolx 10 Seated piriformis pull/push stretch 3 x 15 sec each Standing lumbar extension with forearms on wall x 10 Standing hip extension x 10   PATIENT EDUCATION:  Education details: HEP Person educated: Patient Education method: Consulting civil engineer, Demonstration, Corporate treasurer cues, Verbal cues Education comprehension: verbalized understanding, returned demonstration, verbal cues required, tactile cues required, and needs further education   HOME EXERCISE PROGRAM: Access Code: [email or web address]      ASSESSMENT: CLINICAL IMPRESSION: Patient tolerated therapy well with no adverse effects. She  arrives reporting no pain and recent injections so  therapy focused primarily on lumbar mobility and flexibility, and progressing core and hip strengthening in seated and standing positions. She does require cueing for abdominal engagement with exercises and for posture. No pain reported with therapy this visit. No changes made to HEP. Patient would benefit from continued skilled PT to progress her mobility and strength in order to reduce pain and maximize functional ability.     OBJECTIVE IMPAIRMENTS: Abnormal gait, decreased activity tolerance, decreased ROM, decreased strength, impaired flexibility, postural dysfunction, and pain.    ACTIVITY LIMITATIONS: carrying, lifting, bending, sitting, standing, squatting, sleeping, bed mobility, and locomotion level   PARTICIPATION LIMITATIONS: meal prep, cleaning, laundry, shopping, community activity, and occupation   PERSONAL FACTORS: Fitness, Past/current experiences, Time since onset of injury/illness/exacerbation, and 3+ comorbidities: see PMH above  are also affecting patient's functional outcome.      GOALS: Goals reviewed with patient? Yes   SHORT TERM GOALS: Target date: 01/06/2022   Patient will be I with initial HEP in order to progress with therapy. Baseline: HEP provided at eval 01/05/2022: independent with current HEP Goal status: MET   2.  Patient will report low back and left leg pain </= 2/10 in order to improve ability to tolerate supine and prone positioning to improve sleeping ability Baseline: 4/10 pain at rest that increases with movement and positioning 01/05/2022: patient denies any pain Goal status: MET   LONG TERM GOALS: Target date: 01/27/2022   Patient will be I with final HEP to maintain progress from PT. Baseline: HEP provided at eval Goal status: INITIAL   2.  Patient will report Modified ODI </= 40% disability in order to indicate improved functional ability. Baseline: 60% disability Goal status: INITIAL   3.  Patient will demonstrate a 50% improvement in  her lumbar flexion/extension ROM in order to improve bed mobility and performing household tasks Baseline: grossly 25% of normal motion Goal status: INITIAL   4.  Patient will report no limitations with walking due to pain in order to improve her community access and and grocery shopping ability Baseline: 10 minutes Goal status: INITIAL     PLAN: PT FREQUENCY: 1x/week   PT DURATION: 6 weeks   PLANNED INTERVENTIONS: Therapeutic exercises, Therapeutic activity, Neuromuscular re-education, Balance training, Gait training, Patient/Family education, Self Care, Joint mobilization, Joint manipulation, Aquatic Therapy, Dry Needling, Electrical stimulation, Spinal manipulation, Spinal mobilization, Cryotherapy, Moist heat, Traction, Manual therapy, and Re-evaluation.   PLAN FOR NEXT SESSION: Review HEP and progress PRN, continue with sciatic nerve mobilizations, extension based movements if beneficial, assess and progress strengthening as tolerated   Hilda Blades, PT, DPT, LAT, ATC 01/05/22  10:17 AM Phone: [phone] Fax: [phone]

## 2022-01-11 NOTE — Therapy (Incomplete)
OUTPATIENT PHYSICAL THERAPY TREATMENT NOTE   Patient Name: Donna Thomas MRN: [id number] DOB:[birth year], 36 y.o., female Today's Date: 01/11/2022  PCP: [facility]   REFERRING PROVIDER: Dene Gentry, MD   END OF SESSION:      Past Medical History:  Diagnosis Date   Bipolar I disorder, single manic episode ([place])    Complication of anesthesia    middle sister had malignant hyperthermia   GERD (gastroesophageal reflux disease)    Headache(784.0)    History of depression    History of HPV infection    HSV infection    Hx of abnormal cervical Pap smear    ASCUS   Hypertension    Infection    Malignant hyperthermia    family history:  middle sister   Pregnancy induced hypertension    Vaginal delivery 2008, 2014, 2017   Past Surgical History:  Procedure Laterality Date   LAPAROSCOPIC TUBAL LIGATION Bilateral 04/07/2015   Procedure: LAPAROSCOPIC BILATERAL TUBAL LIGATION WITH FILSHIE CLIPS;  Surgeon: Osborne Oman, MD;  Location: [facility];  Service: Gynecology;  Laterality: Bilateral;   Patient Active Problem List   Diagnosis Date Noted   Encounter for female sterilization procedure 04/07/2015   Essential hypertension 09/29/2014   Obesity 09/01/2014   ASCUS with positive high risk HPV 05/31/2012   Clinical depression 04/26/2011   Avitaminosis D 12/02/2010   Bipolar I disorder, single manic episode ([place]) 04/26/2010   Genital herpes 04/26/2010    REFERRING DIAG: Radiculopathy of lumbar region   THERAPY DIAG:  No diagnosis found.  Rationale for Evaluation and Treatment Rehabilitation  PERTINENT HISTORY: History of bipolar, depression, HTN   PRECAUTIONS: Fall    SUBJECTIVE:         SUBJECTIVE STATEMENT: Patient reports she had an injection yesterday, and the doctor told her she can still do therapy but she can't do anything laying down.   PAIN:  Are you having pain?  NPRS scale: 0/10 Pain location: Low back, left Pain description:  Throbbing Aggravating factors: Sitting, lying on back, and walking Relieving factors: Medication   OBJECTIVE: (objective measures completed at initial evaluation unless otherwise dated) PATIENT SURVEYS:  Modified Oswestry 60% disability    MUSCLE LENGTH: Not assessed due to pain   POSTURE:  Patient with slight forward trunk lean and remains in relative hip flexion, weight shift toward right   PALPATION: Not assessed   LUMBAR ROM:    AROM eval  Flexion 25% - fingertips to distal thigh  Extension 25%  Right lateral flexion 75%  Left lateral flexion 50%  Right rotation 75%  Left rotation 50%   (Blank rows = not tested)   LOWER EXTREMITY ROM:                Not assessed at eval due to pain and inability to be in supine positioning     12/30/2021: LE ROM grossly [facility]   LOWER EXTREMITY MMT:     MMT Right eval Left eval Left 12/30/21  Hip flexion 4- 3 4-  Hip extension       Hip abduction       Hip adduction       Hip internal rotation       Hip external rotation       Knee flexion 4 4- 4  Knee extension 5 4 4+  Ankle dorsiflexion 5 5   Ankle plantarflexion       Ankle inversion       Ankle  eversion        (Blank rows = not tested)   LUMBAR SPECIAL TESTS:  Slump positive on left   FUNCTIONAL TESTS:  Not assessed   GAIT: Assistive device utilized: None Level of assistance: Complete Independence Comments: Antalgic on left     TODAY'S TREATMENT:     [facility] Adult PT Treatment:                                                DATE: 01/12/2022 Therapeutic Exercise: Seated piriformis pull/push stretch 2 x 20 sec each Seated hamstring stretch with foot on step-stool 2 x 20 sec Seated clamshell with green 2 x 20 Seated forward abdominal set with physioball 2 x 10 with 5 sec hold Seated lateral abdominal set with physioball 2 x 10 with 5 sec hold Standing hip extension with green at knees 2 x 10 Standing hip abduction with green at knees 2 x 10 Standing lumbar  extension with forearms on wall 2 x 10 Standing pilates springboard bar pull down with yell 2 x 10   [facility] Adult PT Treatment:                                                DATE: 01/05/2022 Therapeutic Exercise: Seated piriformis pull/push stretch 2 x 20 sec each Seated hamstring stretch with foot on step-stool 2 x 20 sec Seated clamshell with green 2 x 20 Seated forward abdominal set with physioball 2 x 10 with 5 sec hold Seated lateral abdominal set with physioball 2 x 10 with 5 sec hold Standing hip extension with green at knees 2 x 10 Standing hip abduction with green at knees 2 x 10 Standing lumbar extension with forearms on wall 2 x 10 Standing pilates springboard bar pull down with yell 2 x 10  [facility] Adult PT Treatment:                                                DATE: 12/30/2021 Therapeutic Exercise: LTR x 10 - bolster under trunk SLR 2 x 10 each with focus on abdominal engagement - bolster under trunk Hooklying clamshell with red 2 x 20 Seated piriformis pull/push stretch 2 x 20 sec each Seated hamstring stretch with foot on step-stool 2 x 20 sec Standing lumbar extension with forearms on wall x 10 Standing hip extension 2 x 10 Manual: LAD for left LE 3 x 1 min bouts with gentle oscillations - patient positioned supine with bolster under trunk   PATIENT EDUCATION:  Education details: HEP Person educated: Patient Education method: Consulting civil engineer, Media planner, Corporate treasurer cues, Verbal cues Education comprehension: verbalized understanding, returned demonstration, verbal cues required, tactile cues required, and needs further education   HOME EXERCISE PROGRAM: Access Code: [email or web address]      ASSESSMENT: CLINICAL IMPRESSION: Patient tolerated therapy well with no adverse effects. *** Patient would benefit from continued skilled PT to progress her mobility and strength in order to reduce pain and maximize functional ability.  She arrives reporting no pain and recent injections  so therapy focused primarily on lumbar mobility and flexibility, and progressing  core and hip strengthening in seated and standing positions. She does require cueing for abdominal engagement with exercises and for posture. No pain reported with therapy this visit. No changes made to HEP.      OBJECTIVE IMPAIRMENTS: Abnormal gait, decreased activity tolerance, decreased ROM, decreased strength, impaired flexibility, postural dysfunction, and pain.    ACTIVITY LIMITATIONS: carrying, lifting, bending, sitting, standing, squatting, sleeping, bed mobility, and locomotion level   PARTICIPATION LIMITATIONS: meal prep, cleaning, laundry, shopping, community activity, and occupation   PERSONAL FACTORS: Fitness, Past/current experiences, Time since onset of injury/illness/exacerbation, and 3+ comorbidities: see PMH above  are also affecting patient's functional outcome.      GOALS: Goals reviewed with patient? Yes   SHORT TERM GOALS: Target date: 01/06/2022   Patient will be I with initial HEP in order to progress with therapy. Baseline: HEP provided at eval 01/05/2022: independent with current HEP Goal status: MET   2.  Patient will report low back and left leg pain </= 2/10 in order to improve ability to tolerate supine and prone positioning to improve sleeping ability Baseline: 4/10 pain at rest that increases with movement and positioning 01/05/2022: patient denies any pain Goal status: MET   LONG TERM GOALS: Target date: 01/27/2022   Patient will be I with final HEP to maintain progress from PT. Baseline: HEP provided at eval Goal status: INITIAL   2.  Patient will report Modified ODI </= 40% disability in order to indicate improved functional ability. Baseline: 60% disability Goal status: INITIAL   3.  Patient will demonstrate a 50% improvement in her lumbar flexion/extension ROM in order to improve bed mobility and performing household tasks Baseline: grossly 25% of normal  motion Goal status: INITIAL   4.  Patient will report no limitations with walking due to pain in order to improve her community access and and grocery shopping ability Baseline: 10 minutes Goal status: INITIAL     PLAN: PT FREQUENCY: 1x/week   PT DURATION: 6 weeks   PLANNED INTERVENTIONS: Therapeutic exercises, Therapeutic activity, Neuromuscular re-education, Balance training, Gait training, Patient/Family education, Self Care, Joint mobilization, Joint manipulation, Aquatic Therapy, Dry Needling, Electrical stimulation, Spinal manipulation, Spinal mobilization, Cryotherapy, Moist heat, Traction, Manual therapy, and Re-evaluation.   PLAN FOR NEXT SESSION: Review HEP and progress PRN, continue with sciatic nerve mobilizations, extension based movements if beneficial, assess and progress strengthening as tolerated   Hilda Blades, PT, DPT, LAT, ATC 01/11/22  3:41 PM Phone: [phone] Fax: [phone]

## 2022-01-12 ENCOUNTER — Ambulatory Visit: Payer: Medicaid Other | Admitting: Physical Therapy

## 2022-01-13 ENCOUNTER — Telehealth: Payer: Self-pay | Admitting: Physical Therapy

## 2022-01-13 NOTE — Telephone Encounter (Signed)
Attempted to contact patient due to missed PT appointment on 01/12/2022. Left VM informing patient of missed appointment and next scheduled appointment, reminder of attendance policy.  Rosana Hoes, PT, DPT, LAT, ATC 01/13/22  9:36 AM Phone: [phone] Fax: [phone]

## 2022-01-18 NOTE — Therapy (Signed)
OUTPATIENT PHYSICAL THERAPY TREATMENT NOTE   Patient Name: Donna Thomas MRN: [id number] DOB:10-12-1985, 37 y.o., female Today's Date: 01/19/2022  PCP: [facility]   REFERRING PROVIDER: Dene Gentry, MD   END OF SESSION:   PT End of Session - 01/19/22 1107     Visit Number 4    Number of Visits 13    Date for PT Re-Evaluation 01/27/22    Authorization Type [employer]    Authorization - Number of Visits 12    PT Start Time 1100    PT Stop Time 5456    PT Time Calculation (min) 44 min    Activity Tolerance Patient tolerated treatment well    Behavior During Therapy WFL for tasks assessed/performed               Past Medical History:  Diagnosis Date   Bipolar I disorder, single manic episode ([place])    Complication of anesthesia    middle sister had malignant hyperthermia   GERD (gastroesophageal reflux disease)    Headache(784.0)    History of depression    History of HPV infection    HSV infection    Hx of abnormal cervical Pap smear    ASCUS   Hypertension    Infection    Malignant hyperthermia    family history:  middle sister   Pregnancy induced hypertension    Vaginal delivery 2008, 2014, 2017   Past Surgical History:  Procedure Laterality Date   LAPAROSCOPIC TUBAL LIGATION Bilateral 04/07/2015   Procedure: LAPAROSCOPIC BILATERAL TUBAL LIGATION WITH FILSHIE CLIPS;  Surgeon: Osborne Oman, MD;  Location: [place];  Service: Gynecology;  Laterality: Bilateral;   Patient Active Problem List   Diagnosis Date Noted   Encounter for female sterilization procedure 04/07/2015   Essential hypertension 09/29/2014   Obesity 09/01/2014   ASCUS with positive high risk HPV 05/31/2012   Clinical depression 04/26/2011   Avitaminosis D 12/02/2010   Bipolar I disorder, single manic episode ([place]) 04/26/2010   Genital herpes 04/26/2010    REFERRING DIAG: Radiculopathy of lumbar region   THERAPY DIAG:  Other low back pain  Pain in left  leg  Muscle weakness (generalized)  Rationale for Evaluation and Treatment Rehabilitation  PERTINENT HISTORY: History of bipolar, depression, HTN   PRECAUTIONS: Fall    SUBJECTIVE:         SUBJECTIVE STATEMENT: Patient reports she missed the last appointment because she has having leg and back pain so couldn't move much. Her left hip was throbbing and her back was having sharp pains. She states she couldn't move and the pain just gradually went away. Currently denies any pain.  PAIN:  Are you having pain?  NPRS scale: 0/10 Pain location: Low back, left Pain description: Throbbing Aggravating factors: Sitting, lying on back, and walking Relieving factors: Medication   OBJECTIVE: (objective measures completed at initial evaluation unless otherwise dated) PATIENT SURVEYS:  Modified Oswestry 60% disability    MUSCLE LENGTH: Not assessed due to pain   POSTURE:  Patient with slight forward trunk lean and remains in relative hip flexion, weight shift toward right   PALPATION: Not assessed   LUMBAR ROM:    AROM eval 01/19/2022  Flexion 25% - fingertips to distal thigh 50%  - fingertips to knee  Extension 25% 50%  Right lateral flexion 75% 75%  Left lateral flexion 50% 75%  Right rotation 75% 75%  Left rotation 50% 75%   (Blank rows = not tested)  LOWER EXTREMITY ROM:                Not assessed at eval due to pain and inability to be in supine positioning     12/30/2021: LE ROM grossly [facility]   LOWER EXTREMITY MMT:     MMT Right eval Left eval Left 12/30/21 Rt / Lt 01/19/2022  Hip flexion 4- 3 4-   Hip extension        Hip abduction      3 / 4-  Hip adduction        Hip internal rotation        Hip external rotation        Knee flexion 4 4- 4   Knee extension 5 4 4+   Ankle dorsiflexion 5 5    Ankle plantarflexion        Ankle inversion        Ankle eversion         (Blank rows = not tested)   LUMBAR SPECIAL TESTS:  Slump positive on left   FUNCTIONAL  TESTS:  Not assessed   GAIT: Assistive device utilized: None Level of assistance: Complete Independence Comments: Antalgic on left     TODAY'S TREATMENT:     [facility] Adult PT Treatment:                                                DATE: 01/19/2022 Therapeutic Exercise: NuStep L6 x 6 min with UE/LE while taking subejctive LTR on inclined table x 10 Hookling clamshell on inclined table with red 2 x 20 SLR on inclined table 2 x 10 each Sidelying hip abduction 2 x 10 each Seated piriformis pull/push stretch 2 x 20 sec each Seated hamstring stretch with foot on step-stool 2 x 20 sec Seated forward abdominal set with physioball x 10 with 5 sec hold Seated lateral abdominal set with physioball x 10 with 5 sec hold   [facility] Adult PT Treatment:                                                DATE: 01/05/2022 Therapeutic Exercise: Seated piriformis pull/push stretch 2 x 20 sec each Seated hamstring stretch with foot on step-stool 2 x 20 sec Seated clamshell with green 2 x 20 Seated forward abdominal set with physioball 2 x 10 with 5 sec hold Seated lateral abdominal set with physioball 2 x 10 with 5 sec hold Standing hip extension with green at knees 2 x 10 Standing hip abduction with green at knees 2 x 10 Standing lumbar extension with forearms on wall 2 x 10 Standing pilates springboard bar pull down with yell 2 x 10  [facility] Adult PT Treatment:                                                DATE: 12/30/2021 Therapeutic Exercise: LTR x 10 - bolster under trunk SLR 2 x 10 each with focus on abdominal engagement - bolster under trunk Hooklying clamshell with red 2 x 20 Seated piriformis pull/push stretch 2 x 20 sec each  Seated hamstring stretch with foot on step-stool 2 x 20 sec Standing lumbar extension with forearms on wall x 10 Standing hip extension 2 x 10 Manual: LAD for left LE 3 x 1 min bouts with gentle oscillations - patient positioned supine with bolster under trunk   PATIENT  EDUCATION:  Education details: HEP update Person educated: Patient Education method: Explanation, Demonstration, Tactile cues, Verbal cues, Handout Education comprehension: verbalized understanding, returned demonstration, verbal cues required, tactile cues required, and needs further education   HOME EXERCISE PROGRAM: Access Code: [email or web address]      ASSESSMENT: CLINICAL IMPRESSION: Patient tolerated therapy well with no adverse effects. Therapy focused on progressing lumbar and hip mobility, and progressing core and hip strengthening with good tolerance. She did report increase in left hip pain with therapy but she was able to complete all prescribed exercises. She requires cueing for proper exercise technique and proper log roll technique with bed mobility to avoid over stressing lower back. Updated HEP to progress hip strengthening. Patient would benefit from continued skilled PT to progress her mobility and strength in order to reduce pain and maximize functional ability.     OBJECTIVE IMPAIRMENTS: Abnormal gait, decreased activity tolerance, decreased ROM, decreased strength, impaired flexibility, postural dysfunction, and pain.    ACTIVITY LIMITATIONS: carrying, lifting, bending, sitting, standing, squatting, sleeping, bed mobility, and locomotion level   PARTICIPATION LIMITATIONS: meal prep, cleaning, laundry, shopping, community activity, and occupation   PERSONAL FACTORS: Fitness, Past/current experiences, Time since onset of injury/illness/exacerbation, and 3+ comorbidities: see PMH above  are also affecting patient's functional outcome.      GOALS: Goals reviewed with patient? Yes   SHORT TERM GOALS: Target date: 01/06/2022   Patient will be I with initial HEP in order to progress with therapy. Baseline: HEP provided at eval 01/05/2022: independent with current HEP Goal status: MET   2.  Patient will report low back and left leg pain </= 2/10 in order to improve ability to  tolerate supine and prone positioning to improve sleeping ability Baseline: 4/10 pain at rest that increases with movement and positioning 01/05/2022: patient denies any pain Goal status: MET   LONG TERM GOALS: Target date: 01/27/2022   Patient will be I with final HEP to maintain progress from PT. Baseline: HEP provided at eval Goal status: INITIAL   2.  Patient will report Modified ODI </= 40% disability in order to indicate improved functional ability. Baseline: 60% disability Goal status: INITIAL   3.  Patient will demonstrate a 50% improvement in her lumbar flexion/extension ROM in order to improve bed mobility and performing household tasks Baseline: grossly 25% of normal motion Goal status: INITIAL   4.  Patient will report no limitations with walking due to pain in order to improve her community access and and grocery shopping ability Baseline: 10 minutes Goal status: INITIAL     PLAN: PT FREQUENCY: 1x/week   PT DURATION: 6 weeks   PLANNED INTERVENTIONS: Therapeutic exercises, Therapeutic activity, Neuromuscular re-education, Balance training, Gait training, Patient/Family education, Self Care, Joint mobilization, Joint manipulation, Aquatic Therapy, Dry Needling, Electrical stimulation, Spinal manipulation, Spinal mobilization, Cryotherapy, Moist heat, Traction, Manual therapy, and Re-evaluation.   PLAN FOR NEXT SESSION: Review HEP and progress PRN, continue with sciatic nerve mobilizations, extension based movements if beneficial, progress core and hip strengthening as tolerated   Hilda Blades, PT, DPT, LAT, ATC 01/19/22  11:52 AM Phone: [phone] Fax: [phone]

## 2022-01-19 ENCOUNTER — Encounter: Payer: Self-pay | Admitting: Physical Therapy

## 2022-01-19 ENCOUNTER — Other Ambulatory Visit: Payer: Self-pay

## 2022-01-19 ENCOUNTER — Ambulatory Visit: Payer: Medicaid Other | Attending: Family Medicine | Admitting: Physical Therapy

## 2022-01-19 DIAGNOSIS — M79605 Pain in left leg: Secondary | ICD-10-CM | POA: Diagnosis present

## 2022-01-19 DIAGNOSIS — M6281 Muscle weakness (generalized): Secondary | ICD-10-CM | POA: Diagnosis present

## 2022-01-19 DIAGNOSIS — M5459 Other low back pain: Secondary | ICD-10-CM | POA: Diagnosis present

## 2022-01-19 NOTE — Patient Instructions (Signed)
Access Code: [email or web address] URL: [email or web address] Date: 01/19/2022 Prepared by: Hilda Blades  Exercises - Standing Distal Sciatic Nerve Mobilization on Step  - 1-2 x daily - 2 sets - 10 reps - Seated Piriformis Stretch  - 1-2 x daily - 3 reps - 15 seconds hold - Seated Hip Stretch  - 1-2 x daily - 3 reps - 15 seconds hold - Standing Lumbar Extension at [place]  - 1-2 x daily - 2 sets - 10 reps - Standing Hip Extension with Chair  - 1 x daily - 7 x weekly - 2 sets - 10 reps - Supine Lower Trunk Rotation  - 1 x daily - 10 reps - 5 seconds hold - Straight Leg Raise  - 1 x daily - 2 sets - 10 reps - Hooklying Clamshell with Resistance  - 1 x daily - 2 sets - 20 reps - Sidelying Hip Abduction  - 1 x daily - 2 sets - 10 reps

## 2022-01-24 NOTE — Therapy (Signed)
OUTPATIENT PHYSICAL THERAPY TREATMENT NOTE   Patient Name: Donna Thomas MRN: [id number] DOB:05-Mar-1985, 37 y.o., female Today's Date: 01/24/2022  PCP: [facility]   REFERRING PROVIDER: Lenda Kelp, MD   END OF SESSION:       Past Medical History:  Diagnosis Date   Bipolar I disorder, single manic episode (HCC)    Complication of anesthesia    middle sister had malignant hyperthermia   GERD (gastroesophageal reflux disease)    Headache(784.0)    History of depression    History of HPV infection    HSV infection    Hx of abnormal cervical Pap smear    ASCUS   Hypertension    Infection    Malignant hyperthermia    family history:  middle sister   Pregnancy induced hypertension    Vaginal delivery 2008, 2014, 2017   Past Surgical History:  Procedure Laterality Date   LAPAROSCOPIC TUBAL LIGATION Bilateral 04/07/2015   Procedure: LAPAROSCOPIC BILATERAL TUBAL LIGATION WITH FILSHIE CLIPS;  Surgeon: Tereso Newcomer, MD;  Location: [facility];  Service: Gynecology;  Laterality: Bilateral;   Patient Active Problem List   Diagnosis Date Noted   Encounter for female sterilization procedure 04/07/2015   Essential hypertension 09/29/2014   Obesity 09/01/2014   ASCUS with positive high risk HPV 05/31/2012   Clinical depression 04/26/2011   Avitaminosis D 12/02/2010   Bipolar I disorder, single manic episode (HCC) 04/26/2010   Genital herpes 04/26/2010    REFERRING DIAG: Radiculopathy of lumbar region   THERAPY DIAG:  No diagnosis found.  Rationale for Evaluation and Treatment Rehabilitation  PERTINENT HISTORY: History of bipolar, depression, HTN   PRECAUTIONS: Fall    SUBJECTIVE:         SUBJECTIVE STATEMENT: Patient reports she missed the last appointment because she has having leg and back pain so couldn't move much. Her left hip was throbbing and her back was having sharp pains. She states she couldn't move and the pain just gradually went away.  Currently denies any pain.  PAIN:  Are you having pain?  NPRS scale: 0/10 Pain location: Low back, left Pain description: Throbbing Aggravating factors: Sitting, lying on back, and walking Relieving factors: Medication   OBJECTIVE: (objective measures completed at initial evaluation unless otherwise dated) PATIENT SURVEYS:  Modified Oswestry 60% disability    MUSCLE LENGTH: Not assessed due to pain   POSTURE:  Patient with slight forward trunk lean and remains in relative hip flexion, weight shift toward right   PALPATION: Not assessed   LUMBAR ROM:    AROM eval 01/19/2022  Flexion 25% - fingertips to distal thigh 50%  - fingertips to knee  Extension 25% 50%  Right lateral flexion 75% 75%  Left lateral flexion 50% 75%  Right rotation 75% 75%  Left rotation 50% 75%   (Blank rows = not tested)   LOWER EXTREMITY ROM:                Not assessed at eval due to pain and inability to be in supine positioning     12/30/2021: LE ROM grossly [facility]   LOWER EXTREMITY MMT:     MMT Right eval Left eval Left 12/30/21 Rt / Lt 01/19/2022  Hip flexion 4- 3 4-   Hip extension        Hip abduction      3 / 4-  Hip adduction        Hip internal rotation  Hip external rotation        Knee flexion 4 4- 4   Knee extension 5 4 4+   Ankle dorsiflexion 5 5    Ankle plantarflexion        Ankle inversion        Ankle eversion         (Blank rows = not tested)   LUMBAR SPECIAL TESTS:  Slump positive on left   FUNCTIONAL TESTS:  Not assessed   GAIT: Assistive device utilized: None Level of assistance: Complete Independence Comments: Antalgic on left     TODAY'S TREATMENT:     [facility] Adult PT Treatment:                                                DATE: 01/26/2022 Therapeutic Exercise: NuStep L6 x 6 min with UE/LE while taking subejctive LTR on inclined table x 10 Hookling clamshell on inclined table with red 2 x 20 SLR on inclined table 2 x 10 each Sidelying hip  abduction 2 x 10 each Seated piriformis pull/push stretch 2 x 20 sec each Seated hamstring stretch with foot on step-stool 2 x 20 sec Seated forward abdominal set with physioball x 10 with 5 sec hold Seated lateral abdominal set with physioball x 10 with 5 sec hold   [facility] Adult PT Treatment:                                                DATE: 01/19/2022 Therapeutic Exercise: NuStep L6 x 6 min with UE/LE while taking subejctive LTR on inclined table x 10 Hookling clamshell on inclined table with red 2 x 20 SLR on inclined table 2 x 10 each Sidelying hip abduction 2 x 10 each Seated piriformis pull/push stretch 2 x 20 sec each Seated hamstring stretch with foot on step-stool 2 x 20 sec Seated forward abdominal set with physioball x 10 with 5 sec hold Seated lateral abdominal set with physioball x 10 with 5 sec hold  [facility] Adult PT Treatment:                                                DATE: 01/05/2022 Therapeutic Exercise: Seated piriformis pull/push stretch 2 x 20 sec each Seated hamstring stretch with foot on step-stool 2 x 20 sec Seated clamshell with green 2 x 20 Seated forward abdominal set with physioball 2 x 10 with 5 sec hold Seated lateral abdominal set with physioball 2 x 10 with 5 sec hold Standing hip extension with green at knees 2 x 10 Standing hip abduction with green at knees 2 x 10 Standing lumbar extension with forearms on wall 2 x 10 Standing pilates springboard bar pull down with yell 2 x 10   PATIENT EDUCATION:  Education details: HEP update Person educated: Patient Education method: Explanation, Demonstration, Tactile cues, Verbal cues, Handout Education comprehension: verbalized understanding, returned demonstration, verbal cues required, tactile cues required, and needs further education   HOME EXERCISE PROGRAM: Access Code: [email or web address]      ASSESSMENT: CLINICAL IMPRESSION: Patient tolerated therapy well with no adverse  effects. *** Patient would benefit  from continued skilled PT to progress her mobility and strength in order to reduce pain and maximize functional ability.  Therapy focused on progressing lumbar and hip mobility, and progressing core and hip strengthening with good tolerance. She did report increase in left hip pain with therapy but she was able to complete all prescribed exercises. She requires cueing for proper exercise technique and proper log roll technique with bed mobility to avoid over stressing lower back. Updated HEP to progress hip strengthening.      OBJECTIVE IMPAIRMENTS: Abnormal gait, decreased activity tolerance, decreased ROM, decreased strength, impaired flexibility, postural dysfunction, and pain.    ACTIVITY LIMITATIONS: carrying, lifting, bending, sitting, standing, squatting, sleeping, bed mobility, and locomotion level   PARTICIPATION LIMITATIONS: meal prep, cleaning, laundry, shopping, community activity, and occupation   PERSONAL FACTORS: Fitness, Past/current experiences, Time since onset of injury/illness/exacerbation, and 3+ comorbidities: see PMH above  are also affecting patient's functional outcome.      GOALS: Goals reviewed with patient? Yes   SHORT TERM GOALS: Target date: 01/06/2022   Patient will be I with initial HEP in order to progress with therapy. Baseline: HEP provided at eval 01/05/2022: independent with current HEP Goal status: MET   2.  Patient will report low back and left leg pain </= 2/10 in order to improve ability to tolerate supine and prone positioning to improve sleeping ability Baseline: 4/10 pain at rest that increases with movement and positioning 01/05/2022: patient denies any pain Goal status: MET   LONG TERM GOALS: Target date: 01/27/2022   Patient will be I with final HEP to maintain progress from PT. Baseline: HEP provided at eval Goal status: INITIAL   2.  Patient will report Modified ODI </= 40% disability in order to indicate improved functional  ability. Baseline: 60% disability Goal status: INITIAL   3.  Patient will demonstrate a 50% improvement in her lumbar flexion/extension ROM in order to improve bed mobility and performing household tasks Baseline: grossly 25% of normal motion Goal status: INITIAL   4.  Patient will report no limitations with walking due to pain in order to improve her community access and and grocery shopping ability Baseline: 10 minutes Goal status: INITIAL     PLAN: PT FREQUENCY: 1x/week   PT DURATION: 6 weeks   PLANNED INTERVENTIONS: Therapeutic exercises, Therapeutic activity, Neuromuscular re-education, Balance training, Gait training, Patient/Family education, Self Care, Joint mobilization, Joint manipulation, Aquatic Therapy, Dry Needling, Electrical stimulation, Spinal manipulation, Spinal mobilization, Cryotherapy, Moist heat, Traction, Manual therapy, and Re-evaluation.   PLAN FOR NEXT SESSION: Review HEP and progress PRN, continue with sciatic nerve mobilizations, extension based movements if beneficial, progress core and hip strengthening as tolerated   Hilda Blades, PT, DPT, LAT, ATC 01/24/22  1:44 PM Phone: [phone] Fax: [phone]

## 2022-01-26 ENCOUNTER — Ambulatory Visit: Payer: Medicaid Other | Admitting: Physical Therapy

## 2022-01-26 ENCOUNTER — Other Ambulatory Visit: Payer: Self-pay

## 2022-01-26 ENCOUNTER — Encounter: Payer: Self-pay | Admitting: Physical Therapy

## 2022-01-26 DIAGNOSIS — M5459 Other low back pain: Secondary | ICD-10-CM | POA: Diagnosis not present

## 2022-01-26 DIAGNOSIS — M79605 Pain in left leg: Secondary | ICD-10-CM

## 2022-01-26 DIAGNOSIS — M6281 Muscle weakness (generalized): Secondary | ICD-10-CM

## 2022-01-26 NOTE — Patient Instructions (Signed)
Access Code: [email or web address] URL: [email or web address] Date: 01/26/2022 Prepared by: Hilda Blades  Exercises - Standing Distal Sciatic Nerve Mobilization on Step  - 1-2 x daily - 2 sets - 10 reps - Seated Piriformis Stretch  - 1-2 x daily - 3 reps - 15 seconds hold - Seated Hip Stretch  - 1-2 x daily - 3 reps - 15 seconds hold - Standing Lumbar Extension at [place]  - 1-2 x daily - 2 sets - 10 reps - Standing Hip Extension with Chair  - 1 x daily - 7 x weekly - 2 sets - 10 reps - Supine Lower Trunk Rotation  - 1 x daily - 10 reps - 5 seconds hold - Straight Leg Raise  - 1 x daily - 2 sets - 10 reps - Hooklying Clamshell with Resistance  - 1 x daily - 2 sets - 20 reps - Bridge  - 1 x daily - 2 sets - 10 reps - Sidelying Hip Abduction  - 1 x daily - 2 sets - 10 reps

## 2022-02-01 NOTE — Therapy (Signed)
OUTPATIENT PHYSICAL THERAPY TREATMENT NOTE   Patient Name: Donna Thomas MRN: [id number] DOB:1985/10/22, 37 y.o., female Today's Date: 02/02/2022  PCP: [facility] REFERRING PROVIDER: Lenda Kelp, MD   END OF SESSION:   PT End of Session - 02/02/22 1016     Visit Number 6    Number of Visits 13    Date for PT Re-Evaluation 03/09/22    Authorization Type [employer]    Authorization - Number of Visits 12    PT Start Time 1014    PT Stop Time 1055    PT Time Calculation (min) 41 min    Activity Tolerance Patient tolerated treatment well    Behavior During Therapy WFL for tasks assessed/performed                 Past Medical History:  Diagnosis Date   Bipolar I disorder, single manic episode (HCC)    Complication of anesthesia    middle sister had malignant hyperthermia   GERD (gastroesophageal reflux disease)    Headache(784.0)    History of depression    History of HPV infection    HSV infection    Hx of abnormal cervical Pap smear    ASCUS   Hypertension    Infection    Malignant hyperthermia    family history:  middle sister   Pregnancy induced hypertension    Vaginal delivery 2008, 2014, 2017   Past Surgical History:  Procedure Laterality Date   LAPAROSCOPIC TUBAL LIGATION Bilateral 04/07/2015   Procedure: LAPAROSCOPIC BILATERAL TUBAL LIGATION WITH FILSHIE CLIPS;  Surgeon: Tereso Newcomer, MD;  Location: [facility];  Service: Gynecology;  Laterality: Bilateral;   Patient Active Problem List   Diagnosis Date Noted   Encounter for female sterilization procedure 04/07/2015   Essential hypertension 09/29/2014   Obesity 09/01/2014   ASCUS with positive high risk HPV 05/31/2012   Clinical depression 04/26/2011   Avitaminosis D 12/02/2010   Bipolar I disorder, single manic episode (HCC) 04/26/2010   Genital herpes 04/26/2010    REFERRING DIAG: Radiculopathy of lumbar region   THERAPY DIAG:  Other low back pain  Pain in left  leg  Muscle weakness (generalized)  Rationale for Evaluation and Treatment Rehabilitation  PERTINENT HISTORY: History of bipolar, depression, HTN   PRECAUTIONS: Fall    SUBJECTIVE:         SUBJECTIVE STATEMENT: Patient reports she is doing well. She saw her PCP yesterday and has lost some weight. Her exercises are going real good.  PAIN:  Are you having pain?  NPRS scale: 0/10 Pain location: Low back, left Pain description: Throbbing Aggravating factors: Sitting, lying on back, and walking Relieving factors: Medication   OBJECTIVE: (objective measures completed at initial evaluation unless otherwise dated) PATIENT SURVEYS:  Modified Oswestry 60% disability   01/26/2022: 46% disability   MUSCLE LENGTH: Not assessed due to pain   POSTURE:  Patient with slight forward trunk lean and remains in relative hip flexion, weight shift toward right   PALPATION: Not assessed   LUMBAR ROM:    AROM eval 01/19/2022 01/26/22 02/02/2022  Flexion 25% - fingertips to distal thigh 50%  - fingertips to knee 50%  - fingertips to knee WFL without pain  Extension 25% 50% 50%   Right lateral flexion 75% 75% 75%   Left lateral flexion 50% 75% 75%   Right rotation 75% 75% 75%   Left rotation 50% 75% 75%    (Blank rows = not tested)  LOWER EXTREMITY ROM:                Not assessed at eval due to pain and inability to be in supine positioning     12/30/2021: LE ROM grossly [facility]   LOWER EXTREMITY MMT:     MMT Right eval Left eval Left 12/30/21 Rt / Lt 01/19/2022 Rt / Lt 01/26/22  Hip flexion 4- 3 4-  4- / 4-  Hip extension       3+ / 3  Hip abduction      3 / 4- 4- / 3+  Hip adduction         Hip internal rotation         Hip external rotation         Knee flexion 4 4- 4    Knee extension 5 4 4+  5 / 4+  Ankle dorsiflexion 5 5     Ankle plantarflexion         Ankle inversion         Ankle eversion          (Blank rows = not tested)   LUMBAR SPECIAL TESTS:  Slump positive on  left   FUNCTIONAL TESTS:  Not assessed   GAIT: Assistive device utilized: None Level of assistance: Complete Independence Comments: Antalgic on left     TODAY'S TREATMENT:     [facility] Adult PT Treatment:                                                DATE: 02/02/2022 Therapeutic Exercise: NuStep L6 x 5 min with UE/LE while taking subjective Seated hamstring stretch with foot on stool 2 x 30 sec each LTR in figure-4 position x 5 each Supine piriformis push/pull stretch 2 x 20 sec each Bridge 2 x 10 90-90 abdominal hold 2 x 10 with 10 sec Hookling clamshell with blue 2 x 20 Sidelying hip abduction 2 x 10 each Sidelying thoracolumbar rotation x 10 each Sit to stand hip hinge with 15# from 4" box 2 x 10   [facility] Adult PT Treatment:                                                DATE: 01/26/2022 Therapeutic Exercise: NuStep L6 x 5 min with UE/LE while taking subejctive Supine piriformis push/pull stretch 2 x 20 sec each LTR x 10 Bridge 2 x 10 Hookling clamshell with green 2 x 20 Sidelying hip abduction 2 x 10 each Sit to stand hip hinge with 15# from 4" box 2 x 10  [facility] Adult PT Treatment:                                                DATE: 01/19/2022 Therapeutic Exercise: NuStep L6 x 6 min with UE/LE while taking subejctive LTR on inclined table x 10 Hookling clamshell on inclined table with red 2 x 20 SLR on inclined table 2 x 10 each Sidelying hip abduction 2 x 10 each Seated piriformis pull/push stretch 2 x 20 sec each Seated hamstring stretch with foot  on step-stool 2 x 20 sec Seated forward abdominal set with physioball x 10 with 5 sec hold Seated lateral abdominal set with physioball x 10 with 5 sec hold   PATIENT EDUCATION:  Education details: HEP Person educated: Patient Education method: Consulting civil engineer, Demonstration, Tactile cues, Verbal cues Education comprehension: verbalized understanding, returned demonstration, verbal cues required, tactile cues required, and needs  further education   HOME EXERCISE PROGRAM: Access Code: [email or web address]      ASSESSMENT: CLINICAL IMPRESSION: Patient tolerated therapy well with no adverse effects. She demonstrates continued improvement in her lumbar ROM and reports improvement in pain. She denied any left hip pain with therapy this visit. Therapy continues to focus primarily on progressing core and hip strengthening with good tolerance. She does require cueing for proper exercise technique and core control. No changes to HEP this visit but patient was provided a stronger band for strengthening. Patient would benefit from continued skilled PT to progress her mobility and strength in order to reduce pain and maximize functional ability.     OBJECTIVE IMPAIRMENTS: Abnormal gait, decreased activity tolerance, decreased ROM, decreased strength, impaired flexibility, postural dysfunction, and pain.    ACTIVITY LIMITATIONS: carrying, lifting, bending, sitting, standing, squatting, sleeping, bed mobility, and locomotion level   PARTICIPATION LIMITATIONS: meal prep, cleaning, laundry, shopping, community activity, and occupation   PERSONAL FACTORS: Fitness, Past/current experiences, Time since onset of injury/illness/exacerbation, and 3+ comorbidities: see PMH above  are also affecting patient's functional outcome.      GOALS: Goals reviewed with patient? Yes   SHORT TERM GOALS: Target date: 01/06/2022   Patient will be I with initial HEP in order to progress with therapy. Baseline: HEP provided at eval 01/05/2022: independent with current HEP Goal status: MET   2.  Patient will report low back and left leg pain </= 2/10 in order to improve ability to tolerate supine and prone positioning to improve sleeping ability Baseline: 4/10 pain at rest that increases with movement and positioning 01/05/2022: patient denies any pain Goal status: MET   LONG TERM GOALS: Target date: 03/09/2022   Patient will be I with final HEP to  maintain progress from PT. Baseline: HEP provided at eval 01/26/2022: Goal status: ONGOING   2.  Patient will report Modified ODI </= 40% disability in order to indicate improved functional ability. Baseline: 60% disability 01/26/2022: 46% disability Goal status: PARTIALLY MET   3.  Patient will demonstrate a 50% improvement in her lumbar flexion/extension ROM in order to improve bed mobility and performing household tasks Baseline: grossly 25% of normal motion 01/26/2022: continues to exhibit limitations Goal status: PARTIALLY MET   4.  Patient will report no limitations with walking due to pain in order to improve her community access and and grocery shopping ability Baseline: 10 minutes 01/26/2022: patient reports she does have some limitations with walking longer distances. Goal status: PARTIALLY MET     PLAN: PT FREQUENCY: 1x/week   PT DURATION: 6 weeks   PLANNED INTERVENTIONS: Therapeutic exercises, Therapeutic activity, Neuromuscular re-education, Balance training, Gait training, Patient/Family education, Self Care, Joint mobilization, Joint manipulation, Aquatic Therapy, Dry Needling, Electrical stimulation, Spinal manipulation, Spinal mobilization, Cryotherapy, Moist heat, Traction, Manual therapy, and Re-evaluation.   PLAN FOR NEXT SESSION: Review HEP and progress PRN, continue with sciatic nerve mobilizations, extension based movements if beneficial, progress core and hip strengthening as tolerated   Hilda Blades, PT, DPT, LAT, ATC 02/02/22  11:00 AM Phone: [phone] Fax: [phone]

## 2022-02-02 ENCOUNTER — Encounter: Payer: Self-pay | Admitting: Physical Therapy

## 2022-02-02 ENCOUNTER — Ambulatory Visit: Payer: Medicaid Other | Admitting: Physical Therapy

## 2022-02-02 ENCOUNTER — Other Ambulatory Visit: Payer: Self-pay

## 2022-02-02 DIAGNOSIS — M5459 Other low back pain: Secondary | ICD-10-CM

## 2022-02-02 DIAGNOSIS — M79605 Pain in left leg: Secondary | ICD-10-CM

## 2022-02-02 DIAGNOSIS — M6281 Muscle weakness (generalized): Secondary | ICD-10-CM

## 2022-02-08 NOTE — Therapy (Addendum)
OUTPATIENT PHYSICAL THERAPY TREATMENT NOTE  DISCHARGE   Patient Name: Donna Thomas MRN: [id number] DOB:11/14/1985, 37 y.o., female Today's Date: 02/09/2022  PCP: [facility] REFERRING PROVIDER: Lenda Kelp, MD   END OF SESSION:   PT End of Session - 02/09/22 1020     Visit Number 7    Number of Visits 13    Date for PT Re-Evaluation 03/09/22    Authorization Type [employer]    Authorization - Number of Visits 12    PT Start Time 1015    PT Stop Time 1055    PT Time Calculation (min) 40 min    Activity Tolerance Patient tolerated treatment well    Behavior During Therapy WFL for tasks assessed/performed                  Past Medical History:  Diagnosis Date   Bipolar I disorder, single manic episode (HCC)    Complication of anesthesia    middle sister had malignant hyperthermia   GERD (gastroesophageal reflux disease)    Headache(784.0)    History of depression    History of HPV infection    HSV infection    Hx of abnormal cervical Pap smear    ASCUS   Hypertension    Infection    Malignant hyperthermia    family history:  middle sister   Pregnancy induced hypertension    Vaginal delivery 2008, 2014, 2017   Past Surgical History:  Procedure Laterality Date   LAPAROSCOPIC TUBAL LIGATION Bilateral 04/07/2015   Procedure: LAPAROSCOPIC BILATERAL TUBAL LIGATION WITH FILSHIE CLIPS;  Surgeon: Tereso Newcomer, MD;  Location: [facility];  Service: Gynecology;  Laterality: Bilateral;   Patient Active Problem List   Diagnosis Date Noted   Encounter for female sterilization procedure 04/07/2015   Essential hypertension 09/29/2014   Obesity 09/01/2014   ASCUS with positive high risk HPV 05/31/2012   Clinical depression 04/26/2011   Avitaminosis D 12/02/2010   Bipolar I disorder, single manic episode (HCC) 04/26/2010   Genital herpes 04/26/2010    REFERRING DIAG: Radiculopathy of lumbar region   THERAPY DIAG:  Other low back  pain  Pain in left leg  Muscle weakness (generalized)  Rationale for Evaluation and Treatment Rehabilitation  PERTINENT HISTORY: History of bipolar, depression, HTN   PRECAUTIONS: Fall    SUBJECTIVE:         SUBJECTIVE STATEMENT: Patient reports she is doing really good. No new issues and exercises are going really well at home.  PAIN:  Are you having pain?  NPRS scale: 0/10 Pain location: Low back, left Pain description: Throbbing Aggravating factors: Sitting, lying on back, and walking Relieving factors: Medication   OBJECTIVE: (objective measures completed at initial evaluation unless otherwise dated) PATIENT SURVEYS:  Modified Oswestry 60% disability   01/26/2022: 46% disability   MUSCLE LENGTH: Not assessed due to pain   POSTURE:  Patient with slight forward trunk lean and remains in relative hip flexion, weight shift toward right   PALPATION: Not assessed   LUMBAR ROM:    AROM eval 01/19/2022 01/26/22 02/02/2022  Flexion 25% - fingertips to distal thigh 50%  - fingertips to knee 50%  - fingertips to knee WFL without pain  Extension 25% 50% 50%   Right lateral flexion 75% 75% 75%   Left lateral flexion 50% 75% 75%   Right rotation 75% 75% 75%   Left rotation 50% 75% 75%    (Blank rows = not tested)  LOWER EXTREMITY ROM:                Not assessed at eval due to pain and inability to be in supine positioning     12/30/2021: LE ROM grossly [facility]   LOWER EXTREMITY MMT:     MMT Right eval Left eval Left 12/30/21 Rt / Lt 01/19/2022 Rt / Lt 01/26/22 Left 02/09/22  Hip flexion 4- 3 4-  4- / 4-   Hip extension       3+ / 3 3+  Hip abduction      3 / 4- 4- / 3+ 4-  Hip adduction          Hip internal rotation          Hip external rotation          Knee flexion 4 4- 4     Knee extension 5 4 4+  5 / 4+ 5  Ankle dorsiflexion 5 5      Ankle plantarflexion          Ankle inversion          Ankle eversion           (Blank rows = not tested)   LUMBAR SPECIAL  TESTS:  Slump positive on left   FUNCTIONAL TESTS:  Not assessed   GAIT: Assistive device utilized: None Level of assistance: Complete Independence Comments: Antalgic on left     TODAY'S TREATMENT:     [facility] Adult PT Treatment:                                                DATE: 02/09/2022 Therapeutic Exercise: NuStep L6 x 5 min with UE/LE while taking subjective Seated hamstring stretch with foot on stool 2 x 30 sec each LTR in figure-4 position x 10 each Supine piriformis push/pull stretch 3 x 20 sec each Bridge x 10 Figure-4 bridge x 10 each 90-90 abdominal hold 2 x 10 with 10 sec Sidelying hip abduction 2 x 15 each Deadlift with 25# from 4" box 3 x 15 Pallof press with green tubing 2 x 10 each   [facility] Adult PT Treatment:                                                DATE: 02/02/2022 Therapeutic Exercise: NuStep L6 x 5 min with UE/LE while taking subjective Seated hamstring stretch with foot on stool 2 x 30 sec each LTR in figure-4 position x 5 each Supine piriformis push/pull stretch 2 x 20 sec each Bridge 2 x 10 90-90 abdominal hold 2 x 10 with 10 sec Hookling clamshell with blue 2 x 20 Sidelying hip abduction 2 x 10 each Sidelying thoracolumbar rotation x 10 each Sit to stand hip hinge with 15# from 4" box 2 x 10  [facility] Adult PT Treatment:                                                DATE: 01/26/2022 Therapeutic Exercise: NuStep L6 x 5 min with UE/LE while taking subejctive Supine piriformis push/pull stretch  2 x 20 sec each LTR x 10 Bridge 2 x 10 Hookling clamshell with green 2 x 20 Sidelying hip abduction 2 x 10 each Sit to stand hip hinge with 15# from 4" box 2 x 10   PATIENT EDUCATION:  Education details: HEP Person educated: Patient Education method: Explanation, Demonstration, Tactile cues, Verbal cues Education comprehension: verbalized understanding, returned demonstration, verbal cues required, tactile cues required, and needs further education    HOME EXERCISE PROGRAM: Access Code: [email or web address]      ASSESSMENT: CLINICAL IMPRESSION: Patient tolerated therapy well with no adverse effects. Therapy continues to focus on progressing spinal mobility and core and hip strengthening. She was able to progress with her lifting this visit and did require cueing for proper technique but able to perform correctly after cueing and did not report any increased hip or leg pain. She did report some muscular fatigue and burning with hip strengthening exercises. No changes were made to her HEP this visit. Patient would benefit from continued skilled PT to progress her mobility and strength in order to reduce pain and maximize functional ability.     OBJECTIVE IMPAIRMENTS: Abnormal gait, decreased activity tolerance, decreased ROM, decreased strength, impaired flexibility, postural dysfunction, and pain.    ACTIVITY LIMITATIONS: carrying, lifting, bending, sitting, standing, squatting, sleeping, bed mobility, and locomotion level   PARTICIPATION LIMITATIONS: meal prep, cleaning, laundry, shopping, community activity, and occupation   PERSONAL FACTORS: Fitness, Past/current experiences, Time since onset of injury/illness/exacerbation, and 3+ comorbidities: see PMH above  are also affecting patient's functional outcome.      GOALS: Goals reviewed with patient? Yes   SHORT TERM GOALS: Target date: 01/06/2022   Patient will be I with initial HEP in order to progress with therapy. Baseline: HEP provided at eval 01/05/2022: independent with current HEP Goal status: MET   2.  Patient will report low back and left leg pain </= 2/10 in order to improve ability to tolerate supine and prone positioning to improve sleeping ability Baseline: 4/10 pain at rest that increases with movement and positioning 01/05/2022: patient denies any pain Goal status: MET   LONG TERM GOALS: Target date: 03/09/2022   Patient will be I with final HEP to maintain progress from  PT. Baseline: HEP provided at eval 01/26/2022: Goal status: ONGOING   2.  Patient will report Modified ODI </= 40% disability in order to indicate improved functional ability. Baseline: 60% disability 01/26/2022: 46% disability Goal status: PARTIALLY MET   3.  Patient will demonstrate a 50% improvement in her lumbar flexion/extension ROM in order to improve bed mobility and performing household tasks Baseline: grossly 25% of normal motion 01/26/2022: continues to exhibit limitations Goal status: PARTIALLY MET   4.  Patient will report no limitations with walking due to pain in order to improve her community access and and grocery shopping ability Baseline: 10 minutes 01/26/2022: patient reports she does have some limitations with walking longer distances. Goal status: PARTIALLY MET     PLAN: PT FREQUENCY: 1x/week   PT DURATION: 6 weeks   PLANNED INTERVENTIONS: Therapeutic exercises, Therapeutic activity, Neuromuscular re-education, Balance training, Gait training, Patient/Family education, Self Care, Joint mobilization, Joint manipulation, Aquatic Therapy, Dry Needling, Electrical stimulation, Spinal manipulation, Spinal mobilization, Cryotherapy, Moist heat, Traction, Manual therapy, and Re-evaluation.   PLAN FOR NEXT SESSION: Review HEP and progress PRN, continue with sciatic nerve mobilizations, extension based movements if beneficial, progress core and hip strengthening as tolerated   Rosana Hoes, PT, DPT, LAT, ATC 02/09/22  10:58  AM Phone: [phone] Fax: [phone]     PHYSICAL THERAPY DISCHARGE SUMMARY  Visits from Start of Care: 7  Current functional level related to goals / functional outcomes: See above   Remaining deficits: See above   Education / Equipment: HEP   Patient agrees to discharge. Patient goals were not met. Patient is being discharged due to not returning since the last visit.  Rosana Hoes, PT, DPT, LAT, ATC 05/19/22  9:34 AM Phone:  [phone] Fax: [phone]

## 2022-02-09 ENCOUNTER — Ambulatory Visit: Payer: Medicaid Other | Admitting: Physical Therapy

## 2022-02-09 ENCOUNTER — Encounter: Payer: Self-pay | Admitting: Physical Therapy

## 2022-02-09 ENCOUNTER — Other Ambulatory Visit: Payer: Self-pay

## 2022-02-09 DIAGNOSIS — M6281 Muscle weakness (generalized): Secondary | ICD-10-CM

## 2022-02-09 DIAGNOSIS — M5459 Other low back pain: Secondary | ICD-10-CM

## 2022-02-09 DIAGNOSIS — M79605 Pain in left leg: Secondary | ICD-10-CM

## 2022-02-16 ENCOUNTER — Ambulatory Visit: Payer: Medicaid Other | Admitting: Physical Therapy

## 2022-03-29 ENCOUNTER — Ambulatory Visit: Payer: Medicaid Other | Admitting: Obstetrics and Gynecology

## 2022-04-13 ENCOUNTER — Other Ambulatory Visit: Payer: Self-pay | Admitting: Family Medicine

## 2022-06-06 ENCOUNTER — Encounter (HOSPITAL_COMMUNITY): Payer: Self-pay

## 2022-06-06 ENCOUNTER — Ambulatory Visit (HOSPITAL_COMMUNITY)
Admission: EM | Admit: 2022-06-06 | Discharge: 2022-06-06 | Disposition: A | Discharge status: Home / Self Care | Payer: Medicaid Other | Attending: Internal Medicine | Admitting: Internal Medicine

## 2022-06-06 DIAGNOSIS — M5432 Sciatica, left side: Secondary | ICD-10-CM

## 2022-06-06 DIAGNOSIS — I160 Hypertensive urgency: Secondary | ICD-10-CM

## 2022-06-06 MED ORDER — PREDNISONE 10 MG (21) PO TBPK: ORAL_TABLET | Freq: Every day | ORAL | 0 refills | Status: DC

## 2022-06-06 MED ORDER — CEPHALEXIN 500 MG PO CAPS: 500.0000 mg | ORAL_CAPSULE | Freq: Two times a day (BID) | ORAL | 0 refills | Status: DC

## 2022-06-06 MED ORDER — TRAMADOL HCL 50 MG PO TABS: 50.0000 mg | ORAL_TABLET | Freq: Two times a day (BID) | ORAL | 0 refills | Status: DC | PRN

## 2022-06-06 NOTE — ED Triage Notes (Signed)
Pt c/o lower back pain radiating down lt leg since Saturday. States hx of sciatica pain and receives steroid injections monthly. States taking meloxicam with no relief. Denies injury.

## 2022-06-06 NOTE — ED Provider Notes (Signed)
[facility]    CSN: [id number] Arrival date & time: 06/06/22  1041      History   Chief Complaint Chief Complaint  Patient presents with   Leg Pain    HPI Donna Thomas is a 37 y.o. female with a history of sciatica on the left side receiving steroid shots in her lower back every month comes to urgent care with worsening lower back pain radiating into the left thigh.  Patient says the pain has worsened over the past 3 days.  Pain is of moderate severity.  She has been taking meloxicam with no improvement in her symptoms.  No falls or trauma to the back.  She denies any numbness or weakness.  No urinary symptoms.  Patient has a history of hypertension currently on an antihypertensive medication.  Her blood pressure is elevated when she got to the urgent care.  Patient endorses not taking blood pressure medications.  She denies any headaches, chest pain or abdominal pain.   HPI  Past Medical History:  Diagnosis Date   Bipolar I disorder, single manic episode (HCC)    Complication of anesthesia    middle sister had malignant hyperthermia   GERD (gastroesophageal reflux disease)    Headache(784.0)    History of depression    History of HPV infection    HSV infection    Hx of abnormal cervical Pap smear    ASCUS   Hypertension    Infection    Malignant hyperthermia    family history:  middle sister   Pregnancy induced hypertension    Vaginal delivery 2008, 2014, 2017    Patient Active Problem List   Diagnosis Date Noted   Encounter for female sterilization procedure 04/07/2015   Essential hypertension 09/29/2014   Obesity 09/01/2014   ASCUS with positive high risk HPV 05/31/2012   Clinical depression 04/26/2011   Avitaminosis D 12/02/2010   Bipolar I disorder, single manic episode (HCC) 04/26/2010   Genital herpes 04/26/2010    Past Surgical History:  Procedure Laterality Date   LAPAROSCOPIC TUBAL LIGATION Bilateral 04/07/2015   Procedure:  LAPAROSCOPIC BILATERAL TUBAL LIGATION WITH FILSHIE CLIPS;  Surgeon: Tereso Newcomer, MD;  Location: [facility];  Service: Gynecology;  Laterality: Bilateral;    OB History     Gravida  3   Para  3   Term  3   Preterm      AB      Living  3      SAB      IAB      Ectopic      Multiple  0   Live Births  3            Home Medications    Prior to Admission medications   Medication Sig Start Date End Date Taking? Authorizing Provider  predniSONE (STERAPRED UNI-PAK 21 TAB) 10 MG (21) TBPK tablet Take by mouth daily. Take 6 tabs by mouth daily  for 2 days, then 5 tabs for 2 days, then 4 tabs for 2 days, then 3 tabs for 2 days, 2 tabs for 2 days, then 1 tab by mouth daily for 2 days 06/06/22  Yes Lynetta Tomczak, Britta Mccreedy, MD  traMADol (ULTRAM) 50 MG tablet Take 1 tablet (50 mg total) by mouth every 12 (twelve) hours as needed. 06/06/22  Yes Nailyn Dearinger, Britta Mccreedy, MD  docusate sodium (COLACE) 100 MG capsule Take 1 capsule (100 mg total) by mouth 2 (two) times daily as needed. 04/07/15  Anyanwu, Jethro Bastos, MD  meloxicam (MOBIC) 15 MG tablet Take 1 tablet (15 mg total) by mouth daily. 11/08/21   Hudnall, Azucena Fallen, MD  methocarbamol (ROBAXIN) 500 MG tablet Take 1 tablet (500 mg total) by mouth 2 (two) times daily. 10/28/21   Raspet, Erin K, PA-C  ondansetron (ZOFRAN-ODT) 8 MG disintegrating tablet Take 1 tablet (8 mg total) by mouth every 8 (eight) hours as needed for nausea or vomiting. 11/16/21   Molpus, John, MD  valACYclovir (VALTREX) 500 MG tablet Take 500 mg by mouth 2 (two) times daily.    [provider]  valACYclovir (VALTREX) 500 MG tablet TAKE 1 TABLET BY MOUTH TWICE A DAY 01/07/16   Anyanwu, Jethro Bastos, MD  Vitamin D, Ergocalciferol, (DRISDOL) 50000 UNITS CAPS capsule Take 1 capsule (50,000 Units total) by mouth every 7 (seven) days. 10/15/14   Lesly Dukes, MD    Family History Family History  Problem Relation Age of Onset   Diabetes Maternal Grandmother    Cancer  Maternal Grandmother     Social History Social History   Tobacco Use   Smoking status: Never   Smokeless tobacco: Never  Vaping Use   Vaping Use: Never used  Substance Use Topics   Alcohol use: Yes   Drug use: Yes    Types: Marijuana     Allergies   Patient has no known allergies.   Review of Systems Review of Systems As per HPI  Physical Exam Triage Vital Signs ED Triage Vitals [06/06/22 1258]  Enc Vitals Group     BP (!) 188/125     Pulse Rate 75     Resp 18     Temp 98.2 F (36.8 C)     Temp Source Oral     SpO2 98 %     Weight      Height      Head Circumference      Peak Flow      Pain Score 6     Pain Loc      Pain Edu?      Excl. in GC?    No data found.  Updated Vital Signs BP (!) 188/125 (BP Location: Left Arm)   Pulse 75   Temp 98.2 F (36.8 C) (Oral)   Resp 18   LMP 06/04/2022   SpO2 98%   Breastfeeding No   Visual Acuity Right Eye Distance:   Left Eye Distance:   Bilateral Distance:    Right Eye Near:   Left Eye Near:    Bilateral Near:     Physical Exam Vitals and nursing note reviewed.  Constitutional:      General: She is not in acute distress.    Appearance: She is not ill-appearing.  Cardiovascular:     Rate and Rhythm: Normal rate and regular rhythm.     Pulses: Normal pulses.     Heart sounds: Normal heart sounds.  Pulmonary:     Effort: Pulmonary effort is normal.     Breath sounds: Normal breath sounds.  Abdominal:     General: Bowel sounds are normal.     Palpations: Abdomen is soft.  Neurological:     Mental Status: She is alert.      UC Treatments / Results  Labs (all labs ordered are listed, but only abnormal results are displayed) Labs Reviewed - No data to display  EKG   Radiology No results found.  Procedures Procedures (including critical care time)  Medications Ordered in  UC Medications - No data to display  Initial Impression / Assessment and Plan / UC Course  I have reviewed the  triage vital signs and the nursing notes.  Pertinent labs & imaging results that were available during my care of the patient were reviewed by me and considered in my medical decision making (see chart for details).     1.  Left-sided sciatica with worsening symptoms: Not improving with NSAID use Tapering dose of prednisone Tramadol twice daily as needed for pain Patient is advised to follow-up with primary care physician If pain worsens or persist, she may benefit from pain specialist evaluation  2.  Hypertensive urgency: This is secondary to medication noncompliance Patient is advised to take her antihypertensive medications regularly No labs indicated Return to urgent care if you have worsening symptoms. Final Clinical Impressions(s) / UC Diagnoses   Final diagnoses:  Left sided sciatica  Hypertensive urgency     Discharge Instructions      Please take medications as prescribed Heating pad use only 20 minutes on-20 minutes off cycle Gentle stretching exercises Please follow-up with your PCP for further management If your symptoms continue to progress, you may benefit from orthopedic surgery evaluation Please take your blood pressure medications regularly      ED Prescriptions     Medication Sig Dispense Auth. Provider   cephALEXin (KEFLEX) 500 MG capsule  (Status: Discontinued) Take 1 capsule (500 mg total) by mouth 2 (two) times daily for 5 days. 10 capsule Gavrielle Streck, Britta Mccreedy, MD   predniSONE (STERAPRED UNI-PAK 21 TAB) 10 MG (21) TBPK tablet Take by mouth daily. Take 6 tabs by mouth daily  for 2 days, then 5 tabs for 2 days, then 4 tabs for 2 days, then 3 tabs for 2 days, 2 tabs for 2 days, then 1 tab by mouth daily for 2 days 42 tablet Delorice Bannister, Britta Mccreedy, MD   traMADol (ULTRAM) 50 MG tablet Take 1 tablet (50 mg total) by mouth every 12 (twelve) hours as needed. 12 tablet Zurisadai Helminiak, Britta Mccreedy, MD      I have reviewed the PDMP during this encounter.   Merrilee Jansky, MD 06/06/22 1430

## 2022-06-06 NOTE — Discharge Instructions (Addendum)
Please take medications as prescribed Heating pad use only 20 minutes on-20 minutes off cycle Gentle stretching exercises Please follow-up with your PCP for further management If your symptoms continue to progress, you may benefit from orthopedic surgery evaluation Please take your blood pressure medications regularly

## 2023-01-16 ENCOUNTER — Other Ambulatory Visit: Payer: Self-pay

## 2023-01-16 ENCOUNTER — Encounter (HOSPITAL_BASED_OUTPATIENT_CLINIC_OR_DEPARTMENT_OTHER): Payer: Self-pay | Admitting: Emergency Medicine

## 2023-01-16 ENCOUNTER — Emergency Department (HOSPITAL_BASED_OUTPATIENT_CLINIC_OR_DEPARTMENT_OTHER): Payer: Medicaid Other | Admitting: Radiology

## 2023-01-16 DIAGNOSIS — Z20822 Contact with and (suspected) exposure to covid-19: Secondary | ICD-10-CM | POA: Insufficient documentation

## 2023-01-16 DIAGNOSIS — B349 Viral infection, unspecified: Secondary | ICD-10-CM | POA: Diagnosis not present

## 2023-01-16 DIAGNOSIS — Z6837 Body mass index (BMI) 37.0-37.9, adult: Secondary | ICD-10-CM | POA: Insufficient documentation

## 2023-01-16 DIAGNOSIS — E669 Obesity, unspecified: Secondary | ICD-10-CM | POA: Insufficient documentation

## 2023-01-16 DIAGNOSIS — R059 Cough, unspecified: Secondary | ICD-10-CM | POA: Diagnosis present

## 2023-01-16 DIAGNOSIS — I10 Essential (primary) hypertension: Secondary | ICD-10-CM | POA: Insufficient documentation

## 2023-01-16 LAB — CBC
HCT: 41.5 % (ref 36.0–46.0)
Hemoglobin: 14.1 g/dL (ref 12.0–15.0)
MCH: 29.7 pg (ref 26.0–34.0)
MCHC: 34 g/dL (ref 30.0–36.0)
MCV: 87.6 fL (ref 80.0–100.0)
Platelets: 285 10*3/uL (ref 150–400)
RBC: 4.74 MIL/uL (ref 3.87–5.11)
RDW: 12.8 % (ref 11.5–15.5)
WBC: 4.8 10*3/uL (ref 4.0–10.5)
nRBC: 0 % (ref 0.0–0.2)

## 2023-01-16 LAB — RESP PANEL BY RT-PCR (RSV, FLU A&B, COVID)  RVPGX2
Influenza A by PCR: NEGATIVE
Influenza B by PCR: NEGATIVE
Resp Syncytial Virus by PCR: NEGATIVE
SARS Coronavirus 2 by RT PCR: NEGATIVE

## 2023-01-16 LAB — COMPREHENSIVE METABOLIC PANEL
ALT: 10 U/L (ref 0–44)
AST: 12 U/L — ABNORMAL LOW (ref 15–41)
Albumin: 3.9 g/dL (ref 3.5–5.0)
Alkaline Phosphatase: 55 U/L (ref 38–126)
Anion gap: 8 (ref 5–15)
BUN: 9 mg/dL (ref 6–20)
CO2: 24 mmol/L (ref 22–32)
Calcium: 8.6 mg/dL — ABNORMAL LOW (ref 8.9–10.3)
Chloride: 106 mmol/L (ref 98–111)
Creatinine, Ser: 0.77 mg/dL (ref 0.44–1.00)
GFR, Estimated: >60 mL/min (ref >60)
Glucose, Bld: 114 mg/dL — ABNORMAL HIGH (ref 70–99)
Potassium: 3.6 mmol/L (ref 3.5–5.1)
Sodium: 138 mmol/L (ref 135–145)
Total Bilirubin: 0.4 mg/dL (ref 0.0–1.2)
Total Protein: 7.4 g/dL (ref 6.5–8.1)

## 2023-01-16 LAB — LIPASE, BLOOD: Lipase: 14 U/L (ref 11–51)

## 2023-01-16 MED ORDER — ALBUTEROL SULFATE HFA 108 (90 BASE) MCG/ACT IN AERS: 2.0000 | INHALATION_SPRAY | RESPIRATORY_TRACT | Status: DC | PRN

## 2023-01-16 MED ORDER — ACETAMINOPHEN 325 MG PO TABS
650.0000 mg | ORAL_TABLET | Freq: Once | ORAL | Status: AC
  Administered 2023-01-16: 650 mg via ORAL
  Filled 2023-01-16: qty 2

## 2023-01-16 MED ORDER — ONDANSETRON 4 MG PO TBDP
4.0000 mg | ORAL_TABLET | Freq: Once | ORAL | Status: AC | PRN
Start: 2023-01-16 — End: 2023-01-16
  Administered 2023-01-16: 4 mg via ORAL
  Filled 2023-01-16: qty 1

## 2023-01-16 NOTE — ED Triage Notes (Signed)
Pt presents to the ED today for cough, N/V, headache, and fatigue and SOB. Pt states she has had a cough for about a week but today developed the N/V, headache and fatigue. Pt denies fever/chills, denies diarrhea.

## 2023-01-17 ENCOUNTER — Emergency Department (HOSPITAL_BASED_OUTPATIENT_CLINIC_OR_DEPARTMENT_OTHER)
Admission: EM | Admit: 2023-01-17 | Discharge: 2023-01-17 | Disposition: A | Discharge status: Home / Self Care | Payer: Medicaid Other | Attending: Emergency Medicine | Admitting: Emergency Medicine

## 2023-01-17 DIAGNOSIS — B349 Viral infection, unspecified: Secondary | ICD-10-CM

## 2023-01-17 LAB — PREGNANCY, URINE: Preg Test, Ur: NEGATIVE

## 2023-01-17 MED ORDER — ONDANSETRON 4 MG PO TBDP: 4.0000 mg | ORAL_TABLET | Freq: Three times a day (TID) | ORAL | 0 refills | Status: AC | PRN

## 2023-01-17 NOTE — ED Provider Notes (Signed)
 [facility] AT [facility] Provider Note  CSN: [id number] Arrival date & time: 01/16/23 2008  Chief Complaint(s) Cough, Emesis, Fatigue, and Headache  HPI Donna Thomas is a 37 y.o. female here with 6 days of nasal congestion, cough, headache, generalized fatigue and malaise with nausea and nonbloody nonbilious emesis with decreased oral tolerance per patient.  No abdominal pain.  No urinary symptoms.  No known suspicious food intake.  No known sick contacts.  The history is provided by the patient.    Past Medical History Past Medical History:  Diagnosis Date   Bipolar I disorder, single manic episode (HCC)    Complication of anesthesia    middle sister had malignant hyperthermia   GERD (gastroesophageal reflux disease)    Headache(784.0)    History of depression    History of HPV infection    HSV infection    Hx of abnormal cervical Pap smear    ASCUS   Hypertension    Infection    Malignant hyperthermia    family history:  middle sister   Pregnancy induced hypertension    Vaginal delivery 2008, 2014, 2017   Patient Active Problem List   Diagnosis Date Noted   Encounter for female sterilization procedure 04/07/2015   Essential hypertension 09/29/2014   Obesity 09/01/2014   ASCUS with positive high risk HPV 05/31/2012   Clinical depression 04/26/2011   Avitaminosis D 12/02/2010   Bipolar I disorder, single manic episode (HCC) 04/26/2010   Genital herpes 04/26/2010   Home Medication(s) Prior to Admission medications   Medication Sig Start Date End Date Taking? Authorizing Provider  ondansetron  (ZOFRAN -ODT) 4 MG disintegrating tablet Take 1 tablet (4 mg total) by mouth every 8 (eight) hours as needed for up to 3 days for nausea or vomiting. 01/17/23 01/20/23 Yes Aarya Robinson, Raynell Moder, MD  docusate sodium  (COLACE) 100 MG capsule Take 1 capsule (100 mg total) by mouth 2 (two) times daily as needed. 04/07/15   Anyanwu, Ugonna A, MD   meloxicam  (MOBIC ) 15 MG tablet Take 1 tablet (15 mg total) by mouth daily. 11/08/21   Hudnall, Ludie SAUNDERS, MD  methocarbamol  (ROBAXIN ) 500 MG tablet Take 1 tablet (500 mg total) by mouth 2 (two) times daily. 10/28/21   Raspet, Erin K, PA-C  predniSONE  (STERAPRED UNI-PAK 21 TAB) 10 MG (21) TBPK tablet Take by mouth daily. Take 6 tabs by mouth daily  for 2 days, then 5 tabs for 2 days, then 4 tabs for 2 days, then 3 tabs for 2 days, 2 tabs for 2 days, then 1 tab by mouth daily for 2 days 06/06/22   Blaise Aleene KIDD, MD  traMADol  (ULTRAM ) 50 MG tablet Take 1 tablet (50 mg total) by mouth every 12 (twelve) hours as needed. 06/06/22   LampteyAleene KIDD, MD  valACYclovir  (VALTREX ) 500 MG tablet Take 500 mg by mouth 2 (two) times daily.    [provider]  valACYclovir  (VALTREX ) 500 MG tablet TAKE 1 TABLET BY MOUTH TWICE A DAY 01/07/16   Anyanwu, Ugonna A, MD  Vitamin D , Ergocalciferol , (DRISDOL ) 50000 UNITS CAPS capsule Take 1 capsule (50,000 Units total) by mouth every 7 (seven) days. 10/15/14   Cris Burnard DEL, MD  Allergies Patient has no known allergies.  Review of Systems Review of Systems As noted in HPI  Physical Exam Vital Signs  I have reviewed the triage vital signs BP (!) 110/91   Pulse 60   Temp 98.5 F (36.9 C) (Oral)   Resp 18   Ht 5' 6 (1.676 m)   Wt 104.3 kg   LMP 01/15/2023 (Exact Date)   SpO2 98%   BMI 37.12 kg/m   Physical Exam Vitals reviewed.  Constitutional:      General: She is not in acute distress.    Appearance: She is well-developed. She is obese. She is not diaphoretic.  HENT:     Head: Normocephalic and atraumatic.     Right Ear: Tympanic membrane and external ear normal.     Left Ear: Tympanic membrane and external ear normal.     Nose: Nose normal.     Mouth/Throat:     Pharynx: Oropharynx is clear.  Eyes:      General: No scleral icterus.    Conjunctiva/sclera: Conjunctivae normal.  Neck:     Trachea: Phonation normal.  Cardiovascular:     Rate and Rhythm: Normal rate and regular rhythm.  Pulmonary:     Effort: Pulmonary effort is normal. No respiratory distress.     Breath sounds: No stridor.  Abdominal:     General: There is no distension.     Tenderness: There is no abdominal tenderness.  Musculoskeletal:        General: Normal range of motion.     Cervical back: Normal range of motion.  Neurological:     Mental Status: She is alert and oriented to person, place, and time.  Psychiatric:        Behavior: Behavior normal.     ED Results and Treatments Labs (all labs ordered are listed, but only abnormal results are displayed) Labs Reviewed  COMPREHENSIVE METABOLIC PANEL - Abnormal; Notable for the following components:      Result Value   Glucose, Bld 114 (*)    Calcium 8.6 (*)    AST 12 (*)    All other components within normal limits  RESP PANEL BY RT-PCR (RSV, FLU A&B, COVID)  RVPGX2  LIPASE, BLOOD  CBC  PREGNANCY, URINE                                                                                                                         EKG  EKG Interpretation Date/Time:  Monday January 16 2023 20:41:05 EST Ventricular Rate:  99 PR Interval:  142 QRS Duration:  94 QT Interval:  346 QTC Calculation: 444 R Axis:   30  Text Interpretation: Normal sinus rhythm Normal ECG No previous ECGs available Confirmed by Trine Likes [phone]) on 01/17/2023 3:56:28 AM       Radiology DG Chest 2 View Result Date: 01/16/2023 CLINICAL DATA:  Cough, nausea, vomiting, headache, fatigue and shortness of breath. EXAM: CHEST - 2 VIEW COMPARISON:  None Available. FINDINGS:  The heart size and mediastinal contours are within normal limits. Both lungs are clear. The visualized skeletal structures are unremarkable. IMPRESSION: No active cardiopulmonary disease. Electronically Signed   By:  Suzen Dials M.D.   On: 01/16/2023 22:29    Medications Ordered in ED Medications  ondansetron  (ZOFRAN -ODT) disintegrating tablet 4 mg (4 mg Oral Given 01/16/23 2037)  acetaminophen  (TYLENOL ) tablet 650 mg (650 mg Oral Given 01/16/23 2037)   Procedures Procedures  (including critical care time) Medical Decision Making / ED Course   Medical Decision Making Amount and/or Complexity of Data Reviewed Labs: ordered. Decision-making details documented in ED Course. Radiology: ordered and independent interpretation performed. Decision-making details documented in ED Course. ECG/medicine tests: ordered and independent interpretation performed. Decision-making details documented in ED Course.  Risk OTC drugs. Prescription drug management.    Patient presents with viral symptoms for 5 days. Decreased oral hydration. Rest of history as above.  Patient appears well. No signs of toxicity, patient is interactive. No hypoxia, tachypnea or other signs of respiratory distress. No sign of clinical dehydration. Lung exam clear. Rest of exam as above.  CBC without leukocytosis or anemia. CMP without significant electrolyte derangements or renal sufficiency.  No evidence of bili obstruction or pancreatitis UPT negative ruling out pregnancy related process. Chest x-ray without evidence of pneumonia, pneumothorax, pulmonary edema pleural effusions.  Most consistent with viral illness   Tolerated PO.  Discussed symptomatic treatment with the patient and they will follow closely with their PCP.      Final Clinical Impression(s) / ED Diagnoses Final diagnoses:  Viral illness   The patient appears reasonably screened and/or stabilized for discharge and I doubt any other medical condition or other [facility] requiring further screening, evaluation, or treatment in the ED at this time. I have discussed the findings, Dx and Tx plan with the patient/family who expressed understanding and agree(s) with  the plan. Discharge instructions discussed at length. The patient/family was given strict return precautions who verbalized understanding of the instructions. No further questions at time of discharge.  Disposition: Discharge  Condition: Good  ED Discharge Orders          Ordered    ondansetron  (ZOFRAN -ODT) 4 MG disintegrating tablet  Every 8 hours PRN        01/17/23 0421              Follow Up: [facility] [address] [place] [address] [phone]  Call  to schedule an appointment for close follow up     This chart was dictated using voice recognition software.  Despite best efforts to proofread,  errors can occur which can change the documentation meaning.    Trine Raynell Moder, MD 01/17/23 [phone]

## 2023-06-20 ENCOUNTER — Encounter (HOSPITAL_COMMUNITY): Payer: Self-pay

## 2023-06-20 ENCOUNTER — Ambulatory Visit (HOSPITAL_COMMUNITY)
Admission: EM | Admit: 2023-06-20 | Discharge: 2023-06-20 | Disposition: A | Discharge status: Home / Self Care | Attending: Family Medicine | Admitting: Family Medicine

## 2023-06-20 DIAGNOSIS — M5442 Lumbago with sciatica, left side: Secondary | ICD-10-CM

## 2023-06-20 MED ORDER — TIZANIDINE HCL 4 MG PO TABS: 4.0000 mg | ORAL_TABLET | Freq: Three times a day (TID) | ORAL | 0 refills | Status: AC | PRN

## 2023-06-20 MED ORDER — MELOXICAM 15 MG PO TABS: 15.0000 mg | ORAL_TABLET | Freq: Every day | ORAL | 0 refills | Status: AC | PRN

## 2023-06-20 NOTE — ED Provider Notes (Signed)
 [facility]    CSN: [id number] Arrival date & time: 06/20/23  1118      History   Chief Complaint Chief Complaint  Patient presents with   Back Pain    HPI Donna Thomas is a 38 y.o. female.    Back Pain Here for left low back pain that began bothering her again about 2 days ago.  Radiates down into her left lower leg.  No bowel or bladder incontinence though she does experience a little urge when this is happening.  No fever or rash or dysuria.  NKDA  Last menstrual cycle was May 19  Past Medical History:  Diagnosis Date   Bipolar I disorder, single manic episode (HCC)    Complication of anesthesia    middle sister had malignant hyperthermia   GERD (gastroesophageal reflux disease)    Headache(784.0)    History of depression    History of HPV infection    HSV infection    Hx of abnormal cervical Pap smear    ASCUS   Hypertension    Infection    Malignant hyperthermia    family history:  middle sister   Pregnancy induced hypertension    Vaginal delivery 2008, 2014, 2017    Patient Active Problem List   Diagnosis Date Noted   Encounter for female sterilization procedure 04/07/2015   Essential hypertension 09/29/2014   Obesity 09/01/2014   ASCUS with positive high risk HPV 05/31/2012   Clinical depression 04/26/2011   Avitaminosis D 12/02/2010   Bipolar I disorder, single manic episode (HCC) 04/26/2010   Genital herpes 04/26/2010    Past Surgical History:  Procedure Laterality Date   LAPAROSCOPIC TUBAL LIGATION Bilateral 04/07/2015   Procedure: LAPAROSCOPIC BILATERAL TUBAL LIGATION WITH FILSHIE CLIPS;  Surgeon: Julianne Octave, MD;  Location: [facility];  Service: Gynecology;  Laterality: Bilateral;    OB History     Gravida  3   Para  3   Term  3   Preterm      AB      Living  3      SAB      IAB      Ectopic      Multiple  0   Live Births  3            Home Medications    Prior to Admission medications    Medication Sig Start Date End Date Taking? Authorizing Provider  gabapentin (NEURONTIN) 300 MG capsule Take 300 mg by mouth 3 (three) times daily. 06/20/22  Yes [provider]  hydrochlorothiazide (HYDRODIURIL) 25 MG tablet Take 1 tablet by mouth daily. 02/09/22  Yes [provider]  losartan (COZAAR) 100 MG tablet Take 100 mg by mouth daily. 01/13/23  Yes [provider]  meloxicam  (MOBIC ) 15 MG tablet Take 1 tablet (15 mg total) by mouth daily as needed for pain. 06/20/23  Yes Maxfield Gildersleeve K, MD  tiZANidine (ZANAFLEX) 4 MG tablet Take 1 tablet (4 mg total) by mouth every 8 (eight) hours as needed for muscle spasms. 06/20/23  Yes Ann Keto, MD  docusate sodium  (COLACE) 100 MG capsule Take 1 capsule (100 mg total) by mouth 2 (two) times daily as needed. 04/07/15   Anyanwu, Ugonna A, MD  valACYclovir  (VALTREX ) 500 MG tablet Take 500 mg by mouth 2 (two) times daily.    [provider]  Vitamin D , Ergocalciferol , (DRISDOL ) 50000 UNITS CAPS capsule Take 1 capsule (50,000 Units total) by mouth  every 7 (seven) days. 10/15/14   Rik Chasten, MD    Family History Family History  Problem Relation Age of Onset   Diabetes Maternal Grandmother    Cancer Maternal Grandmother     Social History Social History   Tobacco Use   Smoking status: Every Day    Types: Cigars   Smokeless tobacco: Never  Vaping Use   Vaping status: Never Used  Substance Use Topics   Alcohol use: Yes    Comment: occasionally   Drug use: Not Currently    Types: Marijuana     Allergies   Patient has no known allergies.   Review of Systems Review of Systems  Musculoskeletal:  Positive for back pain.     Physical Exam Triage Vital Signs ED Triage Vitals  Encounter Vitals Group     BP 06/20/23 1255 (!) 145/101     Systolic BP Percentile --      Diastolic BP Percentile --      Pulse Rate 06/20/23 1255 86     Resp 06/20/23 1255 16     Temp 06/20/23 1255 98.2 F  (36.8 C)     Temp Source 06/20/23 1255 Oral     SpO2 06/20/23 1255 98 %     Weight --      Height --      Head Circumference --      Peak Flow --      Pain Score 06/20/23 1256 5     Pain Loc --      Pain Education --      Exclude from Growth Chart --    No data found.  Updated Vital Signs BP (!) 145/101 (BP Location: Left Arm)   Pulse 86   Temp 98.2 F (36.8 C) (Oral)   Resp 16   LMP 06/05/2023 (Exact Date)   SpO2 98%   Visual Acuity Right Eye Distance:   Left Eye Distance:   Bilateral Distance:    Right Eye Near:   Left Eye Near:    Bilateral Near:     Physical Exam Vitals reviewed.  Constitutional:      General: She is not in acute distress.    Appearance: She is not ill-appearing, toxic-appearing or diaphoretic.  HENT:     Mouth/Throat:     Mouth: Mucous membranes are moist.  Cardiovascular:     Rate and Rhythm: Normal rate and regular rhythm.     Heart sounds: No murmur heard. Pulmonary:     Effort: Pulmonary effort is normal.     Breath sounds: Normal breath sounds.  Musculoskeletal:     Comments: No deformity of the back.  Straight leg raise is positive: Pain radiates into her left calf with straight leg raise.  No rash or erythema  Skin:    Coloration: Skin is not jaundiced or pale.  Neurological:     General: No focal deficit present.     Mental Status: She is alert and oriented to person, place, and time.  Psychiatric:        Behavior: Behavior normal.      UC Treatments / Results  Labs (all labs ordered are listed, but only abnormal results are displayed) Labs Reviewed - No data to display  EKG   Radiology No results found.  Procedures Procedures (including critical care time)  Medications Ordered in UC Medications - No data to display  Initial Impression / Assessment and Plan / UC Course  I have reviewed the triage vital  signs and the nursing notes.  Pertinent labs & imaging results that were available during my care of the  patient were reviewed by me and considered in my medical decision making (see chart for details).     Meloxicam  and tizanidine are sent in for her symptoms.  I have asked her to follow-up with primary care about this issue. Final Clinical Impressions(s) / UC Diagnoses   Final diagnoses:  Acute left-sided low back pain with left-sided sciatica     Discharge Instructions      Meloxicam  15 mg--1 daily as needed for pain.   Take tizanidine 4 mg--1 every 8 hours as needed for muscle spasms; this medication can cause dizziness and sleepiness  Please make an appointment to follow-up with your primary care about this issue.   ED Prescriptions     Medication Sig Dispense Auth. Provider   meloxicam  (MOBIC ) 15 MG tablet Take 1 tablet (15 mg total) by mouth daily as needed for pain. 30 tablet Angelmarie Ponzo K, MD   tiZANidine (ZANAFLEX) 4 MG tablet Take 1 tablet (4 mg total) by mouth every 8 (eight) hours as needed for muscle spasms. 15 tablet Fox Salminen K, MD      PDMP not reviewed this encounter.   Ann Keto, MD 06/20/23 1328

## 2023-06-20 NOTE — ED Triage Notes (Signed)
 Patient here today with c/o LB pain that radiates down left left since Thursday. Patient has a h/o sciatica.

## 2023-06-20 NOTE — Discharge Instructions (Signed)
 Meloxicam  15 mg--1 daily as needed for pain.   Take tizanidine 4 mg--1 every 8 hours as needed for muscle spasms; this medication can cause dizziness and sleepiness  Please make an appointment to follow-up with your primary care about this issue.

## 2024-02-20 ENCOUNTER — Other Ambulatory Visit: Payer: Self-pay

## 2024-02-20 ENCOUNTER — Emergency Department (HOSPITAL_COMMUNITY)

## 2024-02-20 ENCOUNTER — Emergency Department (HOSPITAL_COMMUNITY)
Admission: EM | Admit: 2024-02-20 | Discharge: 2024-02-20 | Disposition: A | Discharge status: Home / Self Care | Attending: Emergency Medicine | Admitting: Emergency Medicine

## 2024-02-20 ENCOUNTER — Encounter (HOSPITAL_COMMUNITY): Payer: Self-pay

## 2024-02-20 DIAGNOSIS — R519 Headache, unspecified: Secondary | ICD-10-CM | POA: Insufficient documentation

## 2024-02-20 DIAGNOSIS — Z79899 Other long term (current) drug therapy: Secondary | ICD-10-CM | POA: Insufficient documentation

## 2024-02-20 DIAGNOSIS — W19XXXA Unspecified fall, initial encounter: Secondary | ICD-10-CM | POA: Insufficient documentation

## 2024-02-20 DIAGNOSIS — M542 Cervicalgia: Secondary | ICD-10-CM | POA: Insufficient documentation

## 2024-02-20 DIAGNOSIS — Y990 Civilian activity done for income or pay: Secondary | ICD-10-CM | POA: Insufficient documentation

## 2024-02-20 DIAGNOSIS — E236 Other disorders of pituitary gland: Secondary | ICD-10-CM | POA: Insufficient documentation

## 2024-02-20 DIAGNOSIS — R55 Syncope and collapse: Secondary | ICD-10-CM | POA: Insufficient documentation

## 2024-02-20 DIAGNOSIS — M545 Low back pain, unspecified: Secondary | ICD-10-CM | POA: Insufficient documentation

## 2024-02-20 LAB — CBC WITH DIFFERENTIAL/PLATELET
Abs Immature Granulocytes: 0.02 10*3/uL (ref 0.00–0.07)
Basophils Absolute: 0 10*3/uL (ref 0.0–0.1)
Basophils Relative: 1 %
Eosinophils Absolute: 0.1 10*3/uL (ref 0.0–0.5)
Eosinophils Relative: 1 %
HCT: 49.1 % — ABNORMAL HIGH (ref 36.0–46.0)
Hemoglobin: 15.4 g/dL — ABNORMAL HIGH (ref 12.0–15.0)
Immature Granulocytes: 0 %
Lymphocytes Relative: 29 %
Lymphs Abs: 1.6 10*3/uL (ref 0.7–4.0)
MCH: 29.7 pg (ref 26.0–34.0)
MCHC: 31.4 g/dL (ref 30.0–36.0)
MCV: 94.6 fL (ref 80.0–100.0)
Monocytes Absolute: 0.6 10*3/uL (ref 0.1–1.0)
Monocytes Relative: 11 %
Neutro Abs: 3.2 10*3/uL (ref 1.7–7.7)
Neutrophils Relative %: 58 %
Platelets: 271 10*3/uL (ref 150–400)
RBC: 5.19 MIL/uL — ABNORMAL HIGH (ref 3.87–5.11)
RDW: 12.6 % (ref 11.5–15.5)
WBC: 5.6 10*3/uL (ref 4.0–10.5)
nRBC: 0 % (ref 0.0–0.2)

## 2024-02-20 LAB — COMPREHENSIVE METABOLIC PANEL WITH GFR
ALT: 7 U/L (ref 0–44)
AST: 14 U/L — ABNORMAL LOW (ref 15–41)
Albumin: 3.8 g/dL (ref 3.5–5.0)
Alkaline Phosphatase: 77 U/L (ref 38–126)
Anion gap: 11 (ref 5–15)
BUN: 12 mg/dL (ref 6–20)
CO2: 21 mmol/L — ABNORMAL LOW (ref 22–32)
Calcium: 9.1 mg/dL (ref 8.9–10.3)
Chloride: 106 mmol/L (ref 98–111)
Creatinine, Ser: 0.75 mg/dL (ref 0.44–1.00)
GFR, Estimated: >60 mL/min
Glucose, Bld: 91 mg/dL (ref 70–99)
Potassium: 4.1 mmol/L (ref 3.5–5.1)
Sodium: 138 mmol/L (ref 135–145)
Total Bilirubin: 0.5 mg/dL (ref 0.0–1.2)
Total Protein: 6.9 g/dL (ref 6.5–8.1)

## 2024-02-20 LAB — URINALYSIS, ROUTINE W REFLEX MICROSCOPIC
Bilirubin Urine: NEGATIVE
Glucose, UA: NEGATIVE mg/dL
Hgb urine dipstick: NEGATIVE
Ketones, ur: 5 mg/dL — AB
Nitrite: NEGATIVE
Protein, ur: 30 mg/dL — AB
Specific Gravity, Urine: 1.026 (ref 1.005–1.030)
pH: 5 (ref 5.0–8.0)

## 2024-02-20 LAB — CBG MONITORING, ED: Glucose-Capillary: 85 mg/dL (ref 70–99)

## 2024-02-20 LAB — HCG, SERUM, QUALITATIVE: Preg, Serum: NEGATIVE

## 2024-02-20 MED ORDER — ACETAMINOPHEN 325 MG PO TABS
650.0000 mg | ORAL_TABLET | Freq: Once | ORAL | Status: AC
  Administered 2024-02-20: 650 mg via ORAL
  Filled 2024-02-20: qty 2

## 2024-02-20 MED ORDER — LACTATED RINGERS IV BOLUS
1000.0000 mL | Freq: Once | INTRAVENOUS | Status: AC
  Administered 2024-02-20: 1000 mL via INTRAVENOUS

## 2024-02-20 NOTE — ED Notes (Signed)
Pt given cup of ice. 

## 2024-02-20 NOTE — Discharge Instructions (Signed)
 The test today did not show any serious causes for fainting spell.  Follow-up with your primary care doctor to discuss whether or not to proceed with any follow-up testing.  As we discussed, the CT scan did show a finding called an enlarged partially empty sella.  This often can be an incidental finding and not associated with any condition but it can be associated with a condition called idiopathic intracranial hypertension.  Since you do have recurrent headaches.  Consider following up with a neurologist for further evaluation

## 2024-02-20 NOTE — ED Provider Notes (Signed)
 " [place] EMERGENCY DEPARTMENT AT [facility] Provider Note   CSN: [id number] Arrival date & time: 02/20/24  1446     Patient presents with: Loss of Consciousness   Donna Thomas is a 39 y.o. female.    Loss of Consciousness Episode history:  Single Most recent episode:  Today Duration:  1 minute Progression:  Resolved Chronicity:  New Witnessed: yes   Associated symptoms: diaphoresis and weakness   Associated symptoms: no fever and no vomiting   Associated symptoms comment:  Pt was at work and noticed she suddenly felt hot.   The next thing she remembers is being on the ground  Risk factors: no congenital heart disease, no coronary artery disease and no seizures        Prior to Admission medications  Medication Sig Start Date End Date Taking? Authorizing Provider  docusate sodium  (COLACE) 100 MG capsule Take 1 capsule (100 mg total) by mouth 2 (two) times daily as needed. 04/07/15   Anyanwu, Ugonna A, MD  gabapentin (NEURONTIN) 300 MG capsule Take 300 mg by mouth 3 (three) times daily. 06/20/22   [provider]  hydrochlorothiazide (HYDRODIURIL) 25 MG tablet Take 1 tablet by mouth daily. 02/09/22   [provider]  losartan (COZAAR) 100 MG tablet Take 100 mg by mouth daily. 01/13/23   [provider]  meloxicam  (MOBIC ) 15 MG tablet Take 1 tablet (15 mg total) by mouth daily as needed for pain. 06/20/23   Vonna Sharlet POUR, MD  tiZANidine  (ZANAFLEX ) 4 MG tablet Take 1 tablet (4 mg total) by mouth every 8 (eight) hours as needed for muscle spasms. 06/20/23   Vonna Sharlet POUR, MD  valACYclovir  (VALTREX ) 500 MG tablet Take 500 mg by mouth 2 (two) times daily.    [provider]  Vitamin D , Ergocalciferol , (DRISDOL ) 50000 UNITS CAPS capsule Take 1 capsule (50,000 Units total) by mouth every 7 (seven) days. 10/15/14   Cris Burnard DEL, MD    Allergies: Patient has no known allergies.    Review of Systems  Constitutional:  Positive  for diaphoresis. Negative for fever.  Cardiovascular:  Positive for syncope.  Gastrointestinal:  Negative for vomiting.  Neurological:  Positive for weakness.    Updated Vital Signs BP (!) 158/101 (BP Location: Right Arm)   Pulse 89   Temp 98.8 F (37.1 C) (Oral)   Resp 19   SpO2 99%   Physical Exam Vitals and nursing note reviewed.  Constitutional:      General: She is not in acute distress.    Appearance: She is well-developed.  HENT:     Head: Normocephalic and atraumatic.     Right Ear: External ear normal.     Left Ear: External ear normal.  Eyes:     General: No scleral icterus.       Right eye: No discharge.        Left eye: No discharge.     Conjunctiva/sclera: Conjunctivae normal.  Neck:     Trachea: No tracheal deviation.  Cardiovascular:     Rate and Rhythm: Normal rate and regular rhythm.  Pulmonary:     Effort: Pulmonary effort is normal. No respiratory distress.     Breath sounds: Normal breath sounds. No stridor. No wheezing or rales.  Abdominal:     General: Bowel sounds are normal. There is no distension.     Palpations: Abdomen is soft.     Tenderness: There is no abdominal tenderness. There is no guarding  or rebound.  Musculoskeletal:        General: No deformity.     Cervical back: Neck supple. Tenderness present.     Thoracic back: No tenderness.     Lumbar back: Tenderness present.  Skin:    General: Skin is warm and dry.     Findings: No rash.  Neurological:     General: No focal deficit present.     Mental Status: She is alert.     Cranial Nerves: No cranial nerve deficit, dysarthria or facial asymmetry.     Sensory: No sensory deficit.     Motor: No abnormal muscle tone or seizure activity.     Coordination: Coordination normal.  Psychiatric:        Mood and Affect: Mood normal.     (all labs ordered are listed, but only abnormal results are displayed) Labs Reviewed  CBC WITH DIFFERENTIAL/PLATELET - Abnormal; Notable for the  following components:      Result Value   RBC 5.19 (*)    Hemoglobin 15.4 (*)    HCT 49.1 (*)    All other components within normal limits  URINALYSIS, ROUTINE W REFLEX MICROSCOPIC - Abnormal; Notable for the following components:   Color, Urine AMBER (*)    APPearance CLOUDY (*)    Ketones, ur 5 (*)    Protein, ur 30 (*)    Leukocytes,Ua MODERATE (*)    Bacteria, UA RARE (*)    All other components within normal limits  COMPREHENSIVE METABOLIC PANEL WITH GFR - Abnormal; Notable for the following components:   CO2 21 (*)    AST 14 (*)    All other components within normal limits  HCG, SERUM, QUALITATIVE  CBG MONITORING, ED    EKG: EKG Interpretation Date/Time:  Tuesday February 20 2024 15:00:31 EST Ventricular Rate:  102 PR Interval:  135 QRS Duration:  99 QT Interval:  332 QTC Calculation: 433 R Axis:   16  Text Interpretation: Sinus tachycardia Since last tracing rate faster Confirmed by Randol Simmonds [id number]) on 02/20/2024 4:39:14 PM  Radiology: CT Cervical Spine Wo Contrast Result Date: 02/20/2024 CLINICAL DATA:  Neck trauma, midline tenderness (Age 45-64y) EXAM: CT CERVICAL SPINE WITHOUT CONTRAST TECHNIQUE: Multidetector CT imaging of the cervical spine was performed without intravenous contrast. Multiplanar CT image reconstructions were also generated. RADIATION DOSE REDUCTION: This exam was performed according to the departmental dose-optimization program which includes automated exposure control, adjustment of the mA and/or kV according to patient size and/or use of iterative reconstruction technique. COMPARISON:  None Available. FINDINGS: Alignment: Straightening of normal lordosis. No traumatic subluxation. Skull base and vertebrae: No acute fracture. Vertebral body heights are maintained. The dens and skull base are intact. Soft tissues and spinal canal: No prevertebral fluid or swelling. No visible canal hematoma. Disc levels: Posterior spurring at C7-T1 but no significant  spinal canal stenosis. Upper chest: Negative. Other: None. IMPRESSION: Straightening of normal lordosis may be due to positioning or muscle spasm. No acute fracture or subluxation. Electronically Signed   By: Andrea Gasman M.D.   On: 02/20/2024 17:33   CT Head Wo Contrast Result Date: 02/20/2024 CLINICAL DATA:  Head trauma, moderate-severe EXAM: CT HEAD WITHOUT CONTRAST TECHNIQUE: Contiguous axial images were obtained from the base of the skull through the vertex without intravenous contrast. RADIATION DOSE REDUCTION: This exam was performed according to the departmental dose-optimization program which includes automated exposure control, adjustment of the mA and/or kV according to patient size and/or use of iterative reconstruction  technique. COMPARISON:  None Available. FINDINGS: Brain: No intracranial hemorrhage, mass effect, or midline shift. No hydrocephalus. The basilar cisterns are patent. No evidence of territorial infarct or acute ischemia. Enlarged partially empty sella. No extra-axial or intracranial fluid collection. Vascular: No hyperdense vessel or unexpected calcification. Skull: No fracture or focal lesion. Sinuses/Orbits: No acute finding. Other: None. IMPRESSION: 1. No acute intracranial abnormality. No skull fracture. 2. Enlarged partially empty sella, typically an incidental finding, however can be seen in the setting of idiopathic intracranial hypertension. Electronically Signed   By: Andrea Gasman M.D.   On: 02/20/2024 17:29   DG Lumbar Spine Complete Result Date: 02/20/2024 CLINICAL DATA:  Lower back pain after fall EXAM: LUMBAR SPINE - COMPLETE 4+ VIEW COMPARISON:  October 28, 2021 FINDINGS: There is no evidence of lumbar spine fracture. Alignment is normal. Intervertebral disc spaces are maintained. Mild degenerative changes are seen involving the posterior facet joints of L4-5 and L5-S1. 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 No acute abnormality seen.  Electronically Signed   By: Lynwood Landy Raddle M.D.   On: 02/20/2024 17:12   DG Chest 2 View Result Date: 02/20/2024 CLINICAL DATA:  Loss of consciousness.  Patient reports fall. EXAM: CHEST - 2 VIEW COMPARISON:  11/16/2022 FINDINGS: The cardiomediastinal contours are normal. The lungs are clear. Pulmonary vasculature is normal. No consolidation, pleural effusion, or pneumothorax. No acute osseous abnormalities are seen. IMPRESSION: Negative radiographs of the chest. Electronically Signed   By: Andrea Gasman M.D.   On: 02/20/2024 16:39     Procedures   Medications Ordered in the ED  acetaminophen  (TYLENOL ) tablet 650 mg (has no administration in time range)  lactated ringers  bolus 1,000 mL (1,000 mLs Intravenous New Bag/Given 02/20/24 1741)    Clinical Course as of 02/20/24 1935  Tue Feb 20, 2024  1735 Urinalysis, Routine w reflex microscopic -Urine, Clean Catch(!) Suggest contaminated urine specimen [JK]  1735 hCG, serum, qualitative Negative [JK]  1735 CBC WITH DIFFERENTIAL(!) Normal [JK]  1921 Comprehensive metabolic panel with GFR(!) [JK]  1921 CT scan and plain films without acute injury [JK]    Clinical Course User Index [JK] Randol Simmonds, MD                                 Medical Decision Making Problems Addressed: Empty sella: undiagnosed new problem with uncertain prognosis Syncope and collapse: acute illness or injury that poses a threat to life or bodily functions  Amount and/or Complexity of Data Reviewed Labs: ordered. Decision-making details documented in ED Course. Radiology: ordered and independent interpretation performed.  Risk OTC drugs.   Patient presented to the ED for evaluation after a syncopal episode.  Patient did report a prodrome of feeling flushed before it occurred.  She denies any trouble with chest pain or shortness of breath.  Patient is feeling fine at this point.  She does have a slight headache after the fall.  She also has some pain in her  lower back.  Imaging test fortunately do not show any signs of serious injury.  Patient's labs do not show any signs of anemia.  No signs of acute kidney injury.  No dehydration.  Patient is not pregnant.  Allergy of symptoms unclear.  Possibly vasovagal.  Patient does not have any cardiac risk factors.  Will have her follow-up with her primary care doctor to be rechecked.  CT scan does show incidental partial empty  sella.  Patient does report having some recurrent headaches.  Will give her an ambulatory referral for neurology     Final diagnoses:  Syncope and collapse  Empty sella    ED Discharge Orders          Ordered    Ambulatory referral to Neurology       Comments: An appointment is requested in approximately: 2 weeks   02/20/24 WINDELL Randol Simmonds, MD 02/20/24 1937  "

## 2024-04-18 ENCOUNTER — Ambulatory Visit: Admitting: Neurology
